# Patient Record
Sex: Male | Born: 1985 | Race: White | Hispanic: No | Marital: Married | Smoking: Current some day smoker
Health system: Southern US, Community
[De-identification: ages and names within clinical notes are randomized; demographics above are authoritative.]

## PROBLEM LIST (undated history)

## (undated) DIAGNOSIS — I10 Essential (primary) hypertension: Secondary | ICD-10-CM

## (undated) HISTORY — DX: Essential (primary) hypertension: I10

## (undated) HISTORY — PX: TOOTH EXTRACTION: SUR596

---
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
## 2007-03-22 ENCOUNTER — Emergency Department (HOSPITAL_COMMUNITY): Admission: EM | Admit: 2007-03-22 | Discharge: 2007-03-22 | Payer: Self-pay | Admitting: Emergency Medicine

## 2010-05-27 ENCOUNTER — Emergency Department (HOSPITAL_COMMUNITY)
Admission: EM | Admit: 2010-05-27 | Discharge: 2010-05-27 | Payer: Self-pay | Source: Home / Self Care | Admitting: Emergency Medicine

## 2011-05-01 LAB — CBC
MCV: 83.1
Platelets: 252
RBC: 5.3
WBC: 6.8

## 2011-05-01 LAB — DIFFERENTIAL
Eosinophils Absolute: 0.2
Lymphocytes Relative: 32
Lymphs Abs: 2.2
Monocytes Relative: 7
Neutro Abs: 4
Neutrophils Relative %: 58

## 2011-05-01 LAB — RAPID URINE DRUG SCREEN, HOSP PERFORMED
Amphetamines: NOT DETECTED
Benzodiazepines: NOT DETECTED
Cocaine: NOT DETECTED
Opiates: NOT DETECTED
Tetrahydrocannabinol: NOT DETECTED

## 2011-05-01 LAB — URINALYSIS, ROUTINE W REFLEX MICROSCOPIC
Bilirubin Urine: NEGATIVE
Hgb urine dipstick: NEGATIVE
Ketones, ur: NEGATIVE
Nitrite: NEGATIVE
Specific Gravity, Urine: <1.005 — ABNORMAL LOW
pH: 7

## 2011-05-01 LAB — BASIC METABOLIC PANEL
BUN: 7
Calcium: 9.3
Creatinine, Ser: 0.89
GFR calc Af Amer: >60
GFR calc non Af Amer: >60

## 2011-09-30 ENCOUNTER — Emergency Department (HOSPITAL_COMMUNITY): Payer: 59

## 2011-09-30 ENCOUNTER — Encounter (HOSPITAL_COMMUNITY): Payer: Self-pay | Admitting: Emergency Medicine

## 2011-09-30 ENCOUNTER — Emergency Department (HOSPITAL_COMMUNITY)
Admission: EM | Admit: 2011-09-30 | Discharge: 2011-10-01 | Disposition: A | Discharge status: Home / Self Care | Payer: 59 | Attending: Emergency Medicine | Admitting: Emergency Medicine

## 2011-09-30 DIAGNOSIS — M542 Cervicalgia: Secondary | ICD-10-CM | POA: Insufficient documentation

## 2011-09-30 DIAGNOSIS — S42109A Fracture of unspecified part of scapula, unspecified shoulder, initial encounter for closed fracture: Secondary | ICD-10-CM | POA: Insufficient documentation

## 2011-09-30 DIAGNOSIS — Y9383 Activity, rough housing and horseplay: Secondary | ICD-10-CM | POA: Insufficient documentation

## 2011-09-30 DIAGNOSIS — R51 Headache: Secondary | ICD-10-CM | POA: Insufficient documentation

## 2011-09-30 DIAGNOSIS — F17200 Nicotine dependence, unspecified, uncomplicated: Secondary | ICD-10-CM | POA: Insufficient documentation

## 2011-09-30 DIAGNOSIS — W010XXA Fall on same level from slipping, tripping and stumbling without subsequent striking against object, initial encounter: Secondary | ICD-10-CM | POA: Insufficient documentation

## 2011-09-30 LAB — BASIC METABOLIC PANEL
BUN: 10 mg/dL (ref 6–23)
CO2: 24 mEq/L (ref 19–32)
Calcium: 9.6 mg/dL (ref 8.4–10.5)
Glucose, Bld: 102 mg/dL — ABNORMAL HIGH (ref 70–99)
Potassium: 3.7 mEq/L (ref 3.5–5.1)
Sodium: 140 mEq/L (ref 135–145)

## 2011-09-30 LAB — CBC
Hemoglobin: 16.4 g/dL (ref 13.0–17.0)
MCV: 82.6 fL (ref 78.0–100.0)
Platelets: 241 10*3/uL (ref 150–400)
RBC: 5.35 MIL/uL (ref 4.22–5.81)
WBC: 8.5 10*3/uL (ref 4.0–10.5)

## 2011-09-30 LAB — DIFFERENTIAL
Eosinophils Relative: 1 % (ref 0–5)
Lymphocytes Relative: 17 % (ref 12–46)
Lymphs Abs: 1.5 10*3/uL (ref 0.7–4.0)
Monocytes Relative: 6 % (ref 3–12)

## 2011-09-30 MED ORDER — HYDROMORPHONE HCL PF 1 MG/ML IJ SOLN
1.0000 mg | Freq: Once | INTRAMUSCULAR | Status: AC
  Administered 2011-09-30: 1 mg via INTRAVENOUS
  Filled 2011-09-30: qty 1

## 2011-09-30 MED ORDER — OXYCODONE-ACETAMINOPHEN 5-325 MG PO TABS: 1.0000 | ORAL_TABLET | Freq: Four times a day (QID) | ORAL | Status: AC | PRN

## 2011-09-30 MED ORDER — IOHEXOL 300 MG/ML  SOLN
80.0000 mL | Freq: Once | INTRAMUSCULAR | Status: AC | PRN
  Administered 2011-09-30: 80 mL via INTRAVENOUS
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
## 2011-09-30 NOTE — Discharge Instructions (Signed)
Follow up with dr. Hilda Lias Thursday or friday

## 2011-09-30 NOTE — ED Notes (Signed)
To CT via stretcher
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
## 2011-10-06 MED FILL — Oxycodone w/ Acetaminophen Tab 5-325 MG: ORAL | Qty: 6 | Status: AC

## 2018-02-24 DIAGNOSIS — R1084 Generalized abdominal pain: Secondary | ICD-10-CM | POA: Diagnosis not present

## 2018-02-24 DIAGNOSIS — K625 Hemorrhage of anus and rectum: Secondary | ICD-10-CM | POA: Diagnosis not present

## 2018-02-24 DIAGNOSIS — R030 Elevated blood-pressure reading, without diagnosis of hypertension: Secondary | ICD-10-CM | POA: Diagnosis not present

## 2018-03-15 ENCOUNTER — Encounter (INDEPENDENT_AMBULATORY_CARE_PROVIDER_SITE_OTHER): Payer: Self-pay | Admitting: Internal Medicine

## 2018-03-15 ENCOUNTER — Other Ambulatory Visit (INDEPENDENT_AMBULATORY_CARE_PROVIDER_SITE_OTHER): Payer: Self-pay | Admitting: Internal Medicine

## 2018-03-15 ENCOUNTER — Ambulatory Visit (INDEPENDENT_AMBULATORY_CARE_PROVIDER_SITE_OTHER): Payer: Commercial Managed Care - PPO | Admitting: Internal Medicine

## 2018-03-15 VITALS — BP 140/92 | HR 68 | Temp 98.2°F | Resp 18 | Ht 70.0 in | Wt 168.9 lb

## 2018-03-15 DIAGNOSIS — R740 Nonspecific elevation of levels of transaminase and lactic acid dehydrogenase [LDH]: Secondary | ICD-10-CM

## 2018-03-15 DIAGNOSIS — K625 Hemorrhage of anus and rectum: Secondary | ICD-10-CM | POA: Diagnosis not present

## 2018-03-15 DIAGNOSIS — K580 Irritable bowel syndrome with diarrhea: Secondary | ICD-10-CM | POA: Diagnosis not present

## 2018-03-15 DIAGNOSIS — R7401 Elevation of levels of liver transaminase levels: Secondary | ICD-10-CM

## 2018-03-15 MED ORDER — DICYCLOMINE HCL 10 MG PO CAPS: 10.0000 mg | ORAL_CAPSULE | Freq: Three times a day (TID) | ORAL | 1 refills | Status: DC | PRN
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
## 2018-03-15 NOTE — Patient Instructions (Signed)
Colonoscopy to be scheduled. 

## 2018-03-16 ENCOUNTER — Encounter (INDEPENDENT_AMBULATORY_CARE_PROVIDER_SITE_OTHER): Payer: Self-pay | Admitting: *Deleted

## 2018-03-16 DIAGNOSIS — K625 Hemorrhage of anus and rectum: Secondary | ICD-10-CM | POA: Insufficient documentation

## 2018-03-16 LAB — HEPATIC FUNCTION PANEL
AG Ratio: 1.9 (calc) (ref 1.0–2.5)
ALBUMIN MSPROF: 4.5 g/dL (ref 3.6–5.1)
ALT: 60 U/L — ABNORMAL HIGH (ref 9–46)
AST: 43 U/L — AB (ref 10–40)
Alkaline phosphatase (APISO): 80 U/L (ref 40–115)
BILIRUBIN DIRECT: 0.1 mg/dL (ref 0.0–0.2)
BILIRUBIN TOTAL: 0.4 mg/dL (ref 0.2–1.2)
Globulin: 2.4 g/dL (calc) (ref 1.9–3.7)
Indirect Bilirubin: 0.3 mg/dL (calc) (ref 0.2–1.2)
Total Protein: 6.9 g/dL (ref 6.1–8.1)

## 2018-03-16 LAB — HEPATITIS C ANTIBODY
Hepatitis C Ab: NONREACTIVE
SIGNAL TO CUT-OFF: 0.01 (ref <1.00)

## 2018-03-16 LAB — HEPATITIS B SURFACE ANTIGEN: Hepatitis B Surface Ag: NONREACTIVE

## 2018-03-16 LAB — HEPATITIS B SURFACE ANTIBODY,QUALITATIVE: Hep B S Ab: REACTIVE — AB

## 2018-03-17 ENCOUNTER — Ambulatory Visit (INDEPENDENT_AMBULATORY_CARE_PROVIDER_SITE_OTHER): Payer: Self-pay | Admitting: Internal Medicine

## 2018-03-17 ENCOUNTER — Encounter (INDEPENDENT_AMBULATORY_CARE_PROVIDER_SITE_OTHER): Payer: Self-pay | Admitting: Internal Medicine

## 2018-03-17 ENCOUNTER — Other Ambulatory Visit (INDEPENDENT_AMBULATORY_CARE_PROVIDER_SITE_OTHER): Payer: Self-pay | Admitting: *Deleted

## 2018-03-17 DIAGNOSIS — R740 Nonspecific elevation of levels of transaminase and lactic acid dehydrogenase [LDH]: Principal | ICD-10-CM

## 2018-03-17 DIAGNOSIS — R7401 Elevation of levels of liver transaminase levels: Secondary | ICD-10-CM

## 2018-03-17 NOTE — Patient Instructions (Signed)
Ross PrimeDavid D Fricker  03/17/2018     @PREFPERIOPPHARMACY @   Your procedure is scheduled on  03/25/2018   [facility] at  1030  A.M.  Call this number if you have problems the morning of surgery:  [phone]   Remember:  Do not eat or drink after midnight.  You may drink clear liquids until  (follow the instructions given to you) .  Clear liquids allowed are:                    Water, Juice (non-citric and without pulp), Carbonated beverages, Clear Tea, Black Coffee only, Plain Jell-O only, Gatorade and Plain Popsicles only    Take these medicines the morning of surgery with A SIP OF WATER [place].    Do not wear jewelry, make-up or nail polish.  Do not wear lotions, powders, or perfumes, or deodorant.  Do not shave 48 hours prior to surgery.  Men may shave face and neck.  Do not bring valuables to the hospital.  [facility] any belongings or valuables.  Contacts, dentures or bridgework may not be worn into surgery.  Leave your suitcase in the car.  After surgery it may be brought to your room.  For patients admitted to the hospital, discharge time will be determined by your treatment team.  Patients discharged the day of surgery will not be allowed to drive home.   Name and phone number of your driver:   family Special instructions:  Follow the diet and prep instructions given to you by Dr Patty Sermonsehman's office.  Please read over the following fact sheets that you were given. Anesthesia Post-op Instructions and Care and Recovery After Surgery       Colonoscopy, Adult A colonoscopy is an exam to look at the large intestine. It is done to check for problems, such as:  Lumps (tumors).  Growths (polyps).  Swelling (inflammation).  Bleeding.  What happens before the procedure? Eating and drinking Follow instructions from your doctor about eating and drinking. These instructions may include:  A few days before the procedure -  follow a low-fiber diet. ? Avoid nuts. ? Avoid seeds. ? Avoid dried fruit. ? Avoid raw fruits. ? Avoid vegetables.  1-3 days before the procedure - follow a clear liquid diet. Avoid liquids that have red or purple dye. Drink only clear liquids, such as: ? Clear broth or bouillon. ? Black coffee or tea. ? Clear juice. ? Clear soft drinks or sports drinks. ? Gelatin dessert. ? Popsicles.  On the day of the procedure - do not eat or drink anything during the 2 hours before the procedure.  Bowel prep If you were prescribed an oral bowel prep:  Take it as told by your doctor. Starting the day before your procedure, you will need to drink a lot of liquid. The liquid will cause you to poop (have bowel movements) until your poop is almost clear or light green.  If your skin or butt gets irritated from diarrhea, you may: ? Wipe the area with wipes that have medicine in them, such as adult wet wipes with aloe and vitamin E. ? Put something on your skin that soothes the area, such as petroleum jelly.  If you throw up (vomit) while drinking the bowel prep, take a break for up to 60 minutes. Then begin the bowel prep again. If you keep throwing up and you cannot  take the bowel prep without throwing up, call your doctor.  General instructions  Ask your doctor about changing or stopping your normal medicines. This is important if you take diabetes medicines or blood thinners.  Plan to have someone take you home from the hospital or clinic. What happens during the procedure?  An IV tube may be put into one of your veins.  You will be given medicine to help you relax (sedative).  To reduce your risk of infection: ? Your doctors will wash their hands. ? Your anal area will be washed with soap.  You will be asked to lie on your side with your knees bent.  Your doctor will get a long, thin, flexible tube ready. The tube will have a camera and a light on the end.  The tube will be put into  your anus.  The tube will be gently put into your large intestine.  Air will be delivered into your large intestine to keep it open. You may feel some pressure or cramping.  The camera will be used to take photos.  A small tissue sample may be removed from your body to be looked at under a microscope (biopsy). If any possible problems are found, the tissue will be sent to a lab for testing.  If small growths are found, your doctor may remove them and have them checked for cancer.  The tube that was put into your anus will be slowly removed. The procedure may vary among doctors and hospitals. What happens after the procedure?  Your doctor will check on you often until the medicines you were given have worn off.  Do not drive for 24 hours after the procedure.  You may have a small amount of blood in your poop.  You may pass gas.  You may have mild cramps or bloating in your belly (abdomen).  It is up to you to get the results of your procedure. Ask your doctor, or the department performing the procedure, when your results will be ready. This information is not intended to replace advice given to you by your health care provider. Make sure you discuss any questions you have with your health care provider. Document Released: 08/08/2010 Document Revised: 05/06/2016 Document Reviewed: 09/17/2015 Elsevier Interactive Patient Education  2017 [employer]  Colonoscopy, Adult, Care After This sheet gives you information about how to care for yourself after your procedure. Your health care provider may also give you more specific instructions. If you have problems or questions, contact your health care provider. What can I expect after the procedure? After the procedure, it is common to have:  A small amount of blood in your stool for 24 hours after the procedure.  Some gas.  Mild abdominal cramping or bloating.  Follow these instructions at home: General instructions   For the  first 24 hours after the procedure: ? Do not drive or use machinery. ? Do not sign important documents. ? Do not drink alcohol. ? Do your regular daily activities at a slower pace than normal. ? Eat soft, easy-to-digest foods. ? Rest often.  Take over-the-counter or prescription medicines only as told by your health care provider.  It is up to you to get the results of your procedure. Ask your health care provider, or the department performing the procedure, when your results will be ready. Relieving cramping and bloating  Try walking around when you have cramps or feel bloated.  Apply heat to your abdomen as told by your  health care provider. Use a heat source that your health care provider recommends, such as a moist heat pack or a heating pad. ? Place a towel between your skin and the heat source. ? Leave the heat on for 20-30 minutes. ? Remove the heat if your skin turns bright red. This is especially important if you are unable to feel pain, heat, or cold. You may have a greater risk of getting burned. Eating and drinking  Drink enough fluid to keep your urine clear or pale yellow.  Resume your normal diet as instructed by your health care provider. Avoid heavy or fried foods that are hard to digest.  Avoid drinking alcohol for as long as instructed by your health care provider. Contact a health care provider if:  You have blood in your stool 2-3 days after the procedure. Get help right away if:  You have more than a small spotting of blood in your stool.  You pass large blood clots in your stool.  Your abdomen is swollen.  You have nausea or vomiting.  You have a fever.  You have increasing abdominal pain that is not relieved with medicine. This information is not intended to replace advice given to you by your health care provider. Make sure you discuss any questions you have with your health care provider. Document Released: 02/18/2004 Document Revised: 03/30/2016  Document Reviewed: 09/17/2015 Elsevier Interactive Patient Education  2018 [place] Anesthesia is a term that refers to techniques, procedures, and medicines that help a person stay safe and comfortable during a medical procedure. Monitored anesthesia care, or sedation, is one type of anesthesia. Your anesthesia specialist may recommend sedation if you will be having a procedure that does not require you to be unconscious, such as:  Cataract surgery.  A dental procedure.  A biopsy.  A colonoscopy.  During the procedure, you may receive a medicine to help you relax (sedative). There are three levels of sedation:  Mild sedation. At this level, you may feel awake and relaxed. You will be able to follow directions.  Moderate sedation. At this level, you will be sleepy. You may not remember the procedure.  Deep sedation. At this level, you will be asleep. You will not remember the procedure.  The more medicine you are given, the deeper your level of sedation will be. Depending on how you respond to the procedure, the anesthesia specialist may change your level of sedation or the type of anesthesia to fit your needs. An anesthesia specialist will monitor you closely during the procedure. Let your health care provider know about:  Any allergies you have.  All medicines you are taking, including vitamins, herbs, eye drops, creams, and over-the-counter medicines.  Any use of steroids (by mouth or as a cream).  Any problems you or family members have had with sedatives and anesthetic medicines.  Any blood disorders you have.  Any surgeries you have had.  Any medical conditions you have, such as sleep apnea.  Whether you are pregnant or may be pregnant.  Any use of cigarettes, alcohol, or street drugs. What are the risks? Generally, this is a safe procedure. However, problems may occur, including:  Getting too much medicine  (oversedation).  Nausea.  Allergic reaction to medicines.  Trouble breathing. If this happens, a breathing tube may be used to help with breathing. It will be removed when you are awake and breathing on your own.  Heart trouble.  Lung trouble.  Before the  procedure Staying hydrated Follow instructions from your health care provider about hydration, which may include:  Up to 2 hours before the procedure - you may continue to drink clear liquids, such as water, clear fruit juice, black coffee, and plain tea.  Eating and drinking restrictions Follow instructions from your health care provider about eating and drinking, which may include:  8 hours before the procedure - stop eating heavy meals or foods such as meat, fried foods, or fatty foods.  6 hours before the procedure - stop eating light meals or foods, such as toast or cereal.  6 hours before the procedure - stop drinking milk or drinks that contain milk.  2 hours before the procedure - stop drinking clear liquids.  Medicines Ask your health care provider about:  Changing or stopping your regular medicines. This is especially important if you are taking diabetes medicines or blood thinners.  Taking medicines such as aspirin and ibuprofen. These medicines can thin your blood. Do not take these medicines before your procedure if your health care provider instructs you not to.  Tests and exams  You will have a physical exam.  You may have blood tests done to show: ? How well your kidneys and liver are working. ? How well your blood can clot.  General instructions  Plan to have someone take you home from the hospital or clinic.  If you will be going home right after the procedure, plan to have someone with you for 24 hours.  What happens during the procedure?  Your blood pressure, heart rate, breathing, level of pain and overall condition will be monitored.  An IV tube will be inserted into one of your  veins.  Your anesthesia specialist will give you medicines as needed to keep you comfortable during the procedure. This may mean changing the level of sedation.  The procedure will be performed. After the procedure  Your blood pressure, heart rate, breathing rate, and blood oxygen level will be monitored until the medicines you were given have worn off.  Do not drive for 24 hours if you received a sedative.  You may: ? Feel sleepy, clumsy, or nauseous. ? Feel forgetful about what happened after the procedure. ? Have a sore throat if you had a breathing tube during the procedure. ? Vomit. This information is not intended to replace advice given to you by your health care provider. Make sure you discuss any questions you have with your health care provider. Document Released: 04/01/2005 Document Revised: 12/13/2015 Document Reviewed: 10/27/2015 Elsevier Interactive Patient Education  2018 [place], Care After These instructions provide you with information about caring for yourself after your procedure. Your health care provider may also give you more specific instructions. Your treatment has been planned according to current medical practices, but problems sometimes occur. Call your health care provider if you have any problems or questions after your procedure. What can I expect after the procedure? After your procedure, it is common to:  Feel sleepy for several hours.  Feel clumsy and have poor balance for several hours.  Feel forgetful about what happened after the procedure.  Have poor judgment for several hours.  Feel nauseous or vomit.  Have a sore throat if you had a breathing tube during the procedure.  Follow these instructions at home: For at least 24 hours after the procedure:   Do not: ? Participate in activities in which you could fall or become injured. ? Drive. ? Use heavy  machinery. ? Drink alcohol. ? Take sleeping pills or  medicines that cause drowsiness. ? Make important decisions or sign legal documents. ? Take care of children on your own.  Rest. Eating and drinking  Follow the diet that is recommended by your health care provider.  If you vomit, drink water, juice, or soup when you can drink without vomiting.  Make sure you have little or no nausea before eating solid foods. General instructions  Have a responsible adult stay with you until you are awake and alert.  Take over-the-counter and prescription medicines only as told by your health care provider.  If you smoke, do not smoke without supervision.  Keep all follow-up visits as told by your health care provider. This is important. Contact a health care provider if:  You keep feeling nauseous or you keep vomiting.  You feel light-headed.  You develop a rash.  You have a fever. Get help right away if:  You have trouble breathing. This information is not intended to replace advice given to you by your health care provider. Make sure you discuss any questions you have with your health care provider. Document Released: 10/27/2015 Document Revised: 02/26/2016 Document Reviewed: 10/27/2015 Elsevier Interactive Patient Education  [employer].

## 2018-03-22 ENCOUNTER — Encounter (HOSPITAL_COMMUNITY)
Admission: RE | Admit: 2018-03-22 | Discharge: 2018-03-22 | Disposition: A | Discharge status: Home / Self Care | Payer: Commercial Managed Care - PPO | Source: Ambulatory Visit | Attending: Internal Medicine | Admitting: Internal Medicine

## 2018-03-22 ENCOUNTER — Encounter (HOSPITAL_COMMUNITY): Payer: Self-pay

## 2018-03-22 DIAGNOSIS — R740 Nonspecific elevation of levels of transaminase and lactic acid dehydrogenase [LDH]: Secondary | ICD-10-CM | POA: Diagnosis not present

## 2018-03-22 DIAGNOSIS — Z01812 Encounter for preprocedural laboratory examination: Secondary | ICD-10-CM | POA: Diagnosis not present

## 2018-03-22 LAB — CBC WITH DIFFERENTIAL/PLATELET
Basophils Absolute: 0 10*3/uL (ref 0.0–0.1)
Basophils Relative: 0 %
EOS PCT: 2 %
Eosinophils Absolute: 0.2 10*3/uL (ref 0.0–0.7)
HCT: 45.3 % (ref 39.0–52.0)
Hemoglobin: 15.9 g/dL (ref 13.0–17.0)
LYMPHS PCT: 23 %
Lymphs Abs: 2.4 10*3/uL (ref 0.7–4.0)
MCH: 30 pg (ref 26.0–34.0)
MCHC: 35.1 g/dL (ref 30.0–36.0)
MCV: 85.5 fL (ref 78.0–100.0)
MONO ABS: 0.5 10*3/uL (ref 0.1–1.0)
Monocytes Relative: 5 %
Neutro Abs: 7.2 10*3/uL (ref 1.7–7.7)
Neutrophils Relative %: 70 %
PLATELETS: 231 10*3/uL (ref 150–400)
RBC: 5.3 MIL/uL (ref 4.22–5.81)
RDW: 12.2 % (ref 11.5–15.5)
WBC: 10.3 10*3/uL (ref 4.0–10.5)

## 2018-03-24 ENCOUNTER — Ambulatory Visit (HOSPITAL_COMMUNITY)
Admission: RE | Admit: 2018-03-24 | Discharge: 2018-03-24 | Disposition: A | Discharge status: Home / Self Care | Payer: Commercial Managed Care - PPO | Source: Ambulatory Visit | Attending: Internal Medicine | Admitting: Internal Medicine

## 2018-03-24 DIAGNOSIS — R7989 Other specified abnormal findings of blood chemistry: Secondary | ICD-10-CM | POA: Diagnosis not present

## 2018-03-24 DIAGNOSIS — R740 Nonspecific elevation of levels of transaminase and lactic acid dehydrogenase [LDH]: Secondary | ICD-10-CM | POA: Diagnosis not present

## 2018-03-24 DIAGNOSIS — Z01812 Encounter for preprocedural laboratory examination: Secondary | ICD-10-CM | POA: Diagnosis not present

## 2018-03-24 DIAGNOSIS — R7401 Elevation of levels of liver transaminase levels: Secondary | ICD-10-CM

## 2018-03-25 ENCOUNTER — Ambulatory Visit (HOSPITAL_COMMUNITY): Payer: Commercial Managed Care - PPO | Admitting: Anesthesiology

## 2018-03-25 ENCOUNTER — Encounter (HOSPITAL_COMMUNITY): Payer: Self-pay | Admitting: *Deleted

## 2018-03-25 ENCOUNTER — Ambulatory Visit (HOSPITAL_COMMUNITY)
Admission: RE | Admit: 2018-03-25 | Discharge: 2018-03-25 | Disposition: A | Discharge status: Home / Self Care | Payer: Commercial Managed Care - PPO | Source: Ambulatory Visit | Attending: Internal Medicine | Admitting: Internal Medicine

## 2018-03-25 ENCOUNTER — Other Ambulatory Visit: Payer: Self-pay

## 2018-03-25 ENCOUNTER — Encounter (HOSPITAL_COMMUNITY)
Admission: RE | Disposition: A | Discharge status: Home / Self Care | Payer: Self-pay | Source: Ambulatory Visit | Attending: Internal Medicine

## 2018-03-25 DIAGNOSIS — F17210 Nicotine dependence, cigarettes, uncomplicated: Secondary | ICD-10-CM | POA: Diagnosis not present

## 2018-03-25 DIAGNOSIS — R7401 Elevation of levels of liver transaminase levels: Secondary | ICD-10-CM

## 2018-03-25 DIAGNOSIS — I10 Essential (primary) hypertension: Secondary | ICD-10-CM | POA: Insufficient documentation

## 2018-03-25 DIAGNOSIS — K648 Other hemorrhoids: Secondary | ICD-10-CM | POA: Insufficient documentation

## 2018-03-25 DIAGNOSIS — K921 Melena: Secondary | ICD-10-CM | POA: Diagnosis not present

## 2018-03-25 DIAGNOSIS — Z79899 Other long term (current) drug therapy: Secondary | ICD-10-CM | POA: Insufficient documentation

## 2018-03-25 DIAGNOSIS — R740 Nonspecific elevation of levels of transaminase and lactic acid dehydrogenase [LDH]: Secondary | ICD-10-CM

## 2018-03-25 DIAGNOSIS — K625 Hemorrhage of anus and rectum: Secondary | ICD-10-CM | POA: Insufficient documentation

## 2018-03-25 HISTORY — PX: COLONOSCOPY WITH PROPOFOL: SHX5780

## 2018-03-25 LAB — FERRITIN: Ferritin: 114 ng/mL (ref 24–336)

## 2018-03-25 SURGERY — COLONOSCOPY WITH PROPOFOL
Anesthesia: Monitor Anesthesia Care

## 2018-03-25 MED ORDER — MEPERIDINE HCL 100 MG/ML IJ SOLN: 6.2500 mg | INTRAMUSCULAR | Status: DC | PRN

## 2018-03-25 MED ORDER — MIDAZOLAM HCL 2 MG/2ML IJ SOLN
INTRAMUSCULAR | Status: AC
  Filled 2018-03-25: qty 2

## 2018-03-25 MED ORDER — PROMETHAZINE HCL 25 MG/ML IJ SOLN: 6.2500 mg | INTRAMUSCULAR | Status: DC | PRN

## 2018-03-25 MED ORDER — MIDAZOLAM HCL 5 MG/5ML IJ SOLN
INTRAMUSCULAR | Status: DC | PRN
  Administered 2018-03-25: 2 mg via INTRAVENOUS

## 2018-03-25 MED ORDER — PROPOFOL 500 MG/50ML IV EMUL
INTRAVENOUS | Status: DC | PRN
  Administered 2018-03-25: 150 ug/kg/min via INTRAVENOUS
  Administered 2018-03-25: 12:00:00 via INTRAVENOUS

## 2018-03-25 MED ORDER — HYDROCODONE-ACETAMINOPHEN 7.5-325 MG PO TABS: 1.0000 | ORAL_TABLET | Freq: Once | ORAL | Status: DC | PRN

## 2018-03-25 MED ORDER — LACTATED RINGERS IV SOLN: INTRAVENOUS | Status: DC

## 2018-03-25 MED ORDER — PROPOFOL 10 MG/ML IV BOLUS
INTRAVENOUS | Status: DC | PRN
  Administered 2018-03-25 (×4): 20 mg via INTRAVENOUS

## 2018-03-25 MED ORDER — LACTATED RINGERS IV SOLN
INTRAVENOUS | Status: DC
  Administered 2018-03-25: 11:00:00 via INTRAVENOUS

## 2018-03-25 MED ORDER — LIDOCAINE HCL (PF) 1 % IJ SOLN
INTRAMUSCULAR | Status: AC
  Filled 2018-03-25: qty 5

## 2018-03-25 MED ORDER — HYDROMORPHONE HCL 1 MG/ML IJ SOLN: 0.2500 mg | INTRAMUSCULAR | Status: DC | PRN

## 2018-03-25 NOTE — Transfer of Care (Signed)
Immediate Anesthesia Transfer of Care Note  Patient: Ross Phillips  Procedure(s) Performed: COLONOSCOPY WITH PROPOFOL (N/A )  Patient Location: PACU  Anesthesia Type:MAC  Level of Consciousness: awake  Airway & Oxygen Therapy: Patient Spontanous Breathing  Post-op Assessment: Report given to RN  Post vital signs: Reviewed  Last Vitals:  Vitals Value Taken Time  BP 99/55 03/25/2018 12:10 PM  Temp    Pulse 76 03/25/2018 12:13 PM  Resp 19 03/25/2018 12:13 PM  SpO2 98 % 03/25/2018 12:13 PM  Vitals shown include unvalidated device data.  Last Pain:  Vitals:   03/25/18 1137  TempSrc:   PainSc: 0-No pain         Complications: No apparent anesthesia complications

## 2018-03-25 NOTE — Anesthesia Preprocedure Evaluation (Signed)
Anesthesia Evaluation  Patient identified by MRN, date of birth, ID band Patient awake    Reviewed: Allergy & Precautions, H&P , NPO status , Patient's Chart, lab work & pertinent test results, reviewed documented beta blocker date and time   Airway Mallampati: II  TM Distance: >3 FB Neck ROM: full    Dental no notable dental hx. (+) Teeth Intact, Dental Advidsory Given   Pulmonary neg pulmonary ROS, Current Smoker,    Pulmonary exam normal breath sounds clear to auscultation       Cardiovascular Exercise Tolerance: Good hypertension, negative cardio ROS   Rhythm:regular Rate:Normal     Neuro/Psych negative neurological ROS  negative psych ROS   GI/Hepatic negative GI ROS, Neg liver ROS,   Endo/Other  negative endocrine ROS  Renal/GU negative Renal ROS  negative genitourinary   Musculoskeletal   Abdominal   Peds  Hematology negative hematology ROS (+)   Anesthesia Other Findings HTN stable at 140s no meds  Reproductive/Obstetrics negative OB ROS                             Anesthesia Physical Anesthesia Plan  ASA: II  Anesthesia Plan: MAC   Post-op Pain Management:    Induction:   PONV Risk Score and Plan:   Airway Management Planned:   Additional Equipment:   Intra-op Plan:   Post-operative Plan:   Informed Consent: I have reviewed the patients History and Physical, chart, labs and discussed the procedure including the risks, benefits and alternatives for the proposed anesthesia with the patient or authorized representative who has indicated his/her understanding and acceptance.   Dental Advisory Given  Plan Discussed with: CRNA and Anesthesiologist  Anesthesia Plan Comments:         Anesthesia Quick Evaluation

## 2018-03-25 NOTE — Op Note (Signed)
[facility] Patient Name: Ross Phillips Procedure Date: 03/25/2018 11:18 AM MRN: [id number] Date of Birth: [birth year] Attending MD: Lionel December , MD CSN: [id number] Age: 32 Admit Type: Outpatient Procedure:                Colonoscopy Indications:              Hematochezia Providers:                Lionel December, MD, Criselda Peaches. Patsy Lager, RN, Burke Keels, Technician Referring MD:             Lianne Moris, [facility] Medicines:                Propofol per Anesthesia Complications:            No immediate complications. Estimated Blood Loss:     Estimated blood loss: none. Procedure:                Pre-Anesthesia Assessment:                           - Prior to the procedure, a History and Physical                            was performed, and patient medications and                            allergies were reviewed. The patient's tolerance of                            previous anesthesia was also reviewed. The risks                            and benefits of the procedure and the sedation                            options and risks were discussed with the patient.                            All questions were answered, and informed consent                            was obtained. Prior Anticoagulants: The patient has                            taken no previous anticoagulant or antiplatelet                            agents. ASA Grade Assessment: II - A patient with                            mild systemic disease. After reviewing the risks  and benefits, the patient was deemed in                            satisfactory condition to undergo the procedure.                           After obtaining informed consent, the colonoscope                            was passed under direct vision. Throughout the                            procedure, the patient's blood pressure, pulse, and                            oxygen saturations were  monitored continuously. The                            [id number]-[id number] ([id number]) scope was introduced through                            the and advanced to the the terminal ileum, with                            identification of the appendiceal orifice and IC                            valve. The colonoscopy was performed without                            difficulty. The patient tolerated the procedure                            well. The quality of the bowel preparation was                            excellent. The terminal ileum, ileocecal valve,                            appendiceal orifice, and rectum were photographed. Scope In: 11:46:29 AM Scope Out: 12:04:31 PM Scope Withdrawal Time: 0 hours 9 minutes 20 seconds  Total Procedure Duration: 0 hours 18 minutes 2 seconds  Findings:      The perianal and digital rectal examinations were normal.      The terminal ileum appeared normal.      The colon (entire examined portion) appeared normal.      Internal hemorrhoids were found during retroflexion. The hemorrhoids       were medium-sized. Impression:               - The examined portion of the ileum was normal.                           - The entire examined colon is normal.                           -  Internal hemorrhoids.                           - No specimens collected. Moderate Sedation:      Per Anesthesia Care Recommendation:           - Patient has a contact number available for                            emergencies. The signs and symptoms of potential                            delayed complications were discussed with the                            patient. Return to normal activities tomorrow.                            Written discharge instructions were provided to the                            patient.                           - High fiber diet today.                           - Continue present medications.                           - Repeat colonoscopy at age 39  for screening                            purposes. Procedure Code(s):        --- Professional ---                           [phone], Colonoscopy, flexible; diagnostic, including                            collection of specimen(s) by brushing or washing,                            when performed (separate procedure) Diagnosis Code(s):        --- Professional ---                           K64.8, Other hemorrhoids                           K92.1, Melena (includes Hematochezia) CPT copyright 2017 American Medical Association. All rights reserved. The codes documented in this report are preliminary and upon coder review may  be revised to meet current compliance requirements. Lionel December, MD Lionel December, MD 03/25/2018 12:15:49 PM This report has been signed electronically. Number of Addenda: 0

## 2018-03-25 NOTE — Anesthesia Postprocedure Evaluation (Signed)
Anesthesia Post Note  Patient: Ross Phillips  Procedure(s) Performed: COLONOSCOPY WITH PROPOFOL (N/A )  Patient location during evaluation: PACU Anesthesia Type: MAC Level of consciousness: awake and alert and oriented Pain management: pain level controlled Vital Signs Assessment: post-procedure vital signs reviewed and stable Respiratory status: spontaneous breathing Cardiovascular status: blood pressure returned to baseline and stable Postop Assessment: no apparent nausea or vomiting Anesthetic complications: no     Last Vitals:  Vitals:   03/25/18 1054 03/25/18 1210  BP: 121/80 (!) 99/55  Pulse: 78 80  Resp: 18 18  Temp: 36.7 C 36.7 C  SpO2: 98% 97%    Last Pain:  Vitals:   03/25/18 1137  TempSrc:   PainSc: 0-No pain                 Chea Malan

## 2018-03-25 NOTE — Discharge Instructions (Signed)
Resume usual medications.  Keep Goody powder use to minimum. High-fiber diet. No driving for 24 hours. Physician will call with results of blood test.  Colonoscopy, Adult, Care After This sheet gives you information about how to care for yourself after your procedure. Your doctor may also give you more specific instructions. If you have problems or questions, call your doctor. Follow these instructions at home: General instructions   For the first 24 hours after the procedure: ? Do not drive or use machinery. ? Do not sign important documents. ? Do not drink alcohol. ? Do your daily activities more slowly than normal. ? Eat foods that are soft and easy to digest. ? Rest often.  Take over-the-counter or prescription medicines only as told by your doctor.  It is up to you to get the results of your procedure. Ask your doctor, or the department performing the procedure, when your results will be ready. To help cramping and bloating:  Try walking around.  Put heat on your belly (abdomen) as told by your doctor. Use a heat source that your doctor recommends, such as a moist heat pack or a heating pad. ? Put a towel between your skin and the heat source. ? Leave the heat on for 20-30 minutes. ? Remove the heat if your skin turns bright red. This is especially important if you cannot feel pain, heat, or cold. You can get burned. Eating and drinking  Drink enough fluid to keep your pee (urine) clear or pale yellow.  Return to your normal diet as told by your doctor. Avoid heavy or fried foods that are hard to digest.  Avoid drinking alcohol for as long as told by your doctor. Contact a doctor if:  You have blood in your poop (stool) 2-3 days after the procedure. Get help right away if:  You have more than a small amount of blood in your poop.  You see large clumps of tissue (blood clots) in your poop.  Your belly is swollen.  You feel sick to your stomach (nauseous).  You  throw up (vomit).  You have a fever.  You have belly pain that gets worse, and medicine does not help your pain. This information is not intended to replace advice given to you by your health care provider. Make sure you discuss any questions you have with your health care provider. Document Released: 08/08/2010 Document Revised: 03/30/2016 Document Reviewed: 03/30/2016 Elsevier Interactive Patient Education  2017 [employer].

## 2018-03-25 NOTE — H&P (Signed)
Ross Phillips is an 32 y.o. male.   Chief Complaint: Patient is here for colonoscopy. HPI: Patient is 32 year old Caucasian male who was evaluated in the office last week for 1 year history of intermittent rectal bleeding and postprandial diarrhea.  He is therefore undergoing diagnostic colonoscopy. He is also being evaluated for mildly elevated transaminases. Please refer to my my consultation note from last week for rest of the details.  Past Medical History:  Diagnosis Date  . Hypertension     Past Surgical History:  Procedure Laterality Date  . TOOTH EXTRACTION     as a child    Family History  Problem Relation Age of Onset  . Anxiety disorder Mother   . Hypertension Father   . High Cholesterol Father   . Irritable bowel syndrome Sister   . Healthy Brother   . Healthy Son   . Healthy Son    Social History:  reports that he has been smoking cigarettes. He has a 2.00 pack-year smoking history. His smokeless tobacco use includes snuff. He reports that he drinks alcohol. He reports that he does not use drugs.  Allergies: No Known Allergies  Medications Prior to Admission  Medication Sig Dispense Refill  . GOODYS EXTRA STRENGTH 500-325-65 MG PACK Take 1 packet by mouth daily as needed (for pain.).    Marland Kitchen ibuprofen (ADVIL,MOTRIN) 200 MG tablet Take 600-800 mg by mouth every 8 (eight) hours as needed (for pain).    . Naphazoline HCl (CLEAR EYES OP) Place 1 drop into both eyes 3 (three) times daily as needed (dry/irritated eyes.).    Marland Kitchen dicyclomine (BENTYL) 10 MG capsule Take 1 capsule (10 mg total) by mouth 3 (three) times daily as needed for spasms. (Patient not taking: Reported on 03/17/2018) 90 capsule 1    No results found for this or any previous visit (from the past 48 hour(s)). US Abdomen Complete  Result Date: 03/24/2018 CLINICAL DATA:  Elevated ALT EXAM: ABDOMEN ULTRASOUND COMPLETE COMPARISON:  None. FINDINGS: Gallbladder: No gallstones or wall thickening visualized. No  sonographic Murphy sign noted by sonographer. Common bile duct: Diameter: 2 mm Liver: No focal lesion identified. Within normal limits in parenchymal echogenicity. Portal vein is patent on color Doppler imaging with normal direction of blood flow towards the liver. IVC: No abnormality visualized. Pancreas: Visualized portion unremarkable. Spleen: Size and appearance within normal limits. Right Kidney: Length: 8.8 cm. Echogenicity within normal limits. No mass or hydronephrosis visualized. Left Kidney: Length: 10.5 cm. Echogenicity within normal limits. No mass or hydronephrosis visualized. Abdominal aorta: No aneurysm visualized. Other findings: None. IMPRESSION: Normal abdominal sonogram.  Normal liver with no liver masses. Electronically Signed   By: Delbert Phenix M.D.   On: 03/24/2018 13:37    ROS  Blood pressure 121/80, pulse 78, temperature 98.1 F (36.7 C), temperature source Oral, resp. rate 18, SpO2 98 %. Physical Exam  Constitutional: He appears well-developed and well-nourished.  HENT:  Mouth/Throat: Oropharynx is clear and moist.  Eyes: Conjunctivae are normal. No scleral icterus.  Neck: No thyromegaly present.  Cardiovascular: Normal rate, regular rhythm and normal heart sounds.  No murmur heard. Respiratory: Effort normal and breath sounds normal.  GI: Soft. He exhibits no distension and no mass. There is no tenderness.  Lymphadenopathy:    He has no cervical adenopathy.  Neurological: He is alert.  Skin: Skin is warm and dry.  He has multiple tattoos including a few on his chest and upper extremities.     Assessment/Plan Chronic  hematochezia. Diagnostic colonoscopy.  Lionel December, MD 03/25/2018, 11:31 AM

## 2018-03-26 LAB — ALPHA-1-ANTITRYPSIN: A-1 Antitrypsin, Ser: 118 mg/dL (ref 90–200)

## 2018-03-28 ENCOUNTER — Ambulatory Visit (INDEPENDENT_AMBULATORY_CARE_PROVIDER_SITE_OTHER): Payer: Self-pay | Admitting: Internal Medicine

## 2018-03-30 ENCOUNTER — Encounter (HOSPITAL_COMMUNITY): Payer: Self-pay | Admitting: Internal Medicine

## 2018-05-20 ENCOUNTER — Emergency Department (HOSPITAL_COMMUNITY): Payer: Commercial Managed Care - PPO

## 2018-05-20 ENCOUNTER — Emergency Department (HOSPITAL_COMMUNITY)
Admission: EM | Admit: 2018-05-20 | Discharge: 2018-05-20 | Disposition: A | Discharge status: Home / Self Care | Payer: Commercial Managed Care - PPO | Attending: Emergency Medicine | Admitting: Emergency Medicine

## 2018-05-20 ENCOUNTER — Encounter (HOSPITAL_COMMUNITY): Payer: Self-pay | Admitting: Emergency Medicine

## 2018-05-20 ENCOUNTER — Other Ambulatory Visit: Payer: Self-pay

## 2018-05-20 DIAGNOSIS — I10 Essential (primary) hypertension: Secondary | ICD-10-CM | POA: Diagnosis not present

## 2018-05-20 DIAGNOSIS — M25512 Pain in left shoulder: Secondary | ICD-10-CM | POA: Insufficient documentation

## 2018-05-20 DIAGNOSIS — F17210 Nicotine dependence, cigarettes, uncomplicated: Secondary | ICD-10-CM | POA: Insufficient documentation

## 2018-05-20 MED ORDER — NAPROXEN 500 MG PO TABS: 500.0000 mg | ORAL_TABLET | Freq: Two times a day (BID) | ORAL | 0 refills | Status: DC

## 2018-05-20 MED ORDER — METHOCARBAMOL 500 MG PO TABS: 500.0000 mg | ORAL_TABLET | Freq: Three times a day (TID) | ORAL | 0 refills | Status: DC | PRN

## 2018-05-20 NOTE — ED Provider Notes (Signed)
[facility] EMERGENCY DEPARTMENT Provider Note   CSN: [id number] Arrival date & time: 05/20/18  1204     History   Chief Complaint Chief Complaint  Patient presents with  . Shoulder Pain    HPI Ross Phillips is a 32 y.o. male with a hx of tobacco abuse and HTN who presents to the ED with complaints of left shoulder pain that started today while lifting weights. Patient states he was using free weights with bench press type motions and felt a quick onset pain/tightening sensation to the L posterior shoulder area. Pain has been constant since onset, 10/10 in severity, worse with movement and deep breathing, no alleviating factors. He has applied topical patches without much change. Denies traumatic injury. Denies fever, chills, numbness, weakness, or midline neck pain. Hx of L scapular fracture.    HPI  Past Medical History:  Diagnosis Date  . Hypertension     Patient Active Problem List   Diagnosis Date Noted  . Rectal bleeding 03/16/2018    Past Surgical History:  Procedure Laterality Date  . COLONOSCOPY WITH PROPOFOL N/A 03/25/2018   Procedure: COLONOSCOPY WITH PROPOFOL;  Surgeon: Malissa Hippo, MD;  Location: [facility];  Service: Endoscopy;  Laterality: N/A;  12:05  . TOOTH EXTRACTION     as a child        Home Medications    Prior to Admission medications   Medication Sig Start Date End Date Taking? Authorizing Provider  dicyclomine (BENTYL) 10 MG capsule Take 1 capsule (10 mg total) by mouth 3 (three) times daily as needed for spasms. Patient not taking: Reported on 03/17/2018 03/15/18   Malissa Hippo, MD  GOODYS EXTRA STRENGTH [phone] MG PACK Take 1 packet by mouth daily as needed (for pain.).    [provider]  ibuprofen (ADVIL,MOTRIN) 200 MG tablet Take 600-800 mg by mouth every 8 (eight) hours as needed (for pain).    [provider]  Naphazoline HCl (CLEAR EYES OP) Place 1 drop into both eyes 3 (three) times daily as needed  (dry/irritated eyes.).    [provider]    Family History Family History  Problem Relation Age of Onset  . Anxiety disorder Mother   . Hypertension Father   . High Cholesterol Father   . Irritable bowel syndrome Sister   . Healthy Brother   . Healthy Son   . Healthy Son     Social History Social History   Tobacco Use  . Smoking status: Current Some Day Smoker    Packs/day: 0.50    Years: 4.00    Pack years: 2.00    Types: Cigarettes  . Smokeless tobacco: Current User    Types: Snuff  Substance Use Topics  . Alcohol use: Yes    Comment: occasionally  . Drug use: No     Allergies   Patient has no known allergies.   Review of Systems Review of Systems  Constitutional: Negative for chills and fever.  Respiratory: Negative for shortness of breath.   Cardiovascular: Negative for chest pain.  Musculoskeletal: Positive for arthralgias (Left shoulder).  Skin: Negative for color change, rash and wound.  Neurological: Negative for weakness and numbness.     Physical Exam Updated Vital Signs BP (!) 146/92 (BP Location: Right Arm)   Pulse 92   Temp 97.8 F (36.6 C) (Oral)   Resp 17   Ht 5\' 8"  (1.727 m)   Wt 74.8 kg   SpO2 97%   BMI  25.09 kg/m   Physical Exam  Constitutional: He appears well-developed and well-nourished. No distress.  HENT:  Head: Normocephalic and atraumatic.  Eyes: Conjunctivae are normal. Right eye exhibits no discharge. Left eye exhibits no discharge.  Neck:  Full active range of motion.  No midline tenderness to palpation.  No palpable step-off.  Patient has left trapezius tenderness to palpation with palpable muscle spasm.  Cardiovascular: Normal rate and regular rhythm.  2+ symmetric radial pulses.  Pulmonary/Chest: Effort normal and breath sounds normal.  Musculoskeletal:  No obvious deformity, appreciable swelling, erythema, ecchymosis, warmth, open wounds. Back: No midline tenderness Upper extremities: Patient has full  active range of motion to bilateral shoulders, elbows, wrist, and all digits.  He has discomfort with left shoulder flexion as well as abduction.  He is tender to palpation over the supraspinatus area as well as the teres minor and infraspinatus regions.  He is also tender over the trapezius muscle.  No palpable bony tenderness.  Neurovascularly intact distally.  Neurological: He is alert.  Clear speech.  Sensation grossly intact bilateral upper extremities.  5 out of 5 symmetric grip strength.  Able to perform okay sign, thumbs up, and cross second/third digits.  Ambulatory.  Psychiatric: He has a normal mood and affect. His behavior is normal. Thought content normal.  Nursing note and vitals reviewed.    ED Treatments / Results  Labs (all labs ordered are listed, but only abnormal results are displayed) Labs Reviewed - No data to display  EKG None  Radiology Dg Chest 2 View  Result Date: 05/20/2018 CLINICAL DATA:  Left shoulder pain. EXAM: CHEST - 2 VIEW COMPARISON:  01/17/2015 FINDINGS: The heart size and mediastinal contours are within normal limits. Both lungs are clear. The visualized skeletal structures are unremarkable. IMPRESSION: No active cardiopulmonary disease. Electronically Signed   By: Marlan Palau M.D.   On: 05/20/2018 12:38   Dg Shoulder Left  Result Date: 05/20/2018 CLINICAL DATA:  Left shoulder pain EXAM: LEFT SHOULDER - 2+ VIEW COMPARISON:  09/30/2011 FINDINGS: Normal alignment no fracture. AC joint intact. No significant degenerative change. Small sclerotic focus in the medial humeral head is unchanged from the prior study. Scapular fracture on the prior study has healed. IMPRESSION: Negative. Electronically Signed   By: Marlan Palau M.D.   On: 05/20/2018 12:37    Procedures Procedures (including critical care time)  Medications Ordered in ED Medications - No data to display   Initial Impression / Assessment and Plan / ED Course  I have reviewed the triage  vital signs and the nursing notes.  Pertinent labs & imaging results that were available during my care of the patient were reviewed by me and considered in my medical decision making (see chart for details).    Patient presents to the ED with complaints of pain to the  L shoulder pain s/p lifting at the gym. Exam without obvious deformity or open wounds. ROM intact. Tender to palpation over the left trapezius and rotator cuff muscles. NVI distally. Xray negative for fracture/dislocation. No erythema/warmth/fever to raise concern for infectious etiology. Given hx and onset do not suspect PE. CXR without pneumothorax. Suspect muscle strain/spasm, prescriptions for naproxen/robaxin- instructed not to drive/operate heavy machinery when taking robaxin, also recommended application of heat. I discussed results, treatment plan, need for follow-up, and return precautions with the patient. Provided opportunity for questions, patient confirmed understanding and are in agreement with plan.    Final Clinical Impressions(s) / ED Diagnoses   Final diagnoses:  Acute pain of left shoulder    ED Discharge Orders         Ordered    naproxen (NAPROSYN) 500 MG tablet  2 times daily     05/20/18 1317    methocarbamol (ROBAXIN) 500 MG tablet  Every 8 hours PRN     05/20/18 1317           Rice Walsh, [place] R, PA-C 05/20/18 1319    Raeford Razor, MD 05/20/18 1454

## 2018-05-20 NOTE — ED Triage Notes (Signed)
Patient complaining of left shoulder pain while working out. States "I think I may have broken my shoulder blade again."

## 2018-05-20 NOTE — Discharge Instructions (Addendum)
You are seen in the emergency department today for left shoulder pain.  Your x-rays did not show fracture or dislocation or problem with the underlying lung.  We suspect this is a muscle strain/spasm and are sending home with the following medicines:  - Naproxen is a nonsteroidal anti-inflammatory medication that will help with pain and swelling. Be sure to take this medication as prescribed with food, 1 pill every 12 hours,  It should be taken with food, as it can cause stomach upset, and more seriously, stomach bleeding. Do not take other nonsteroidal anti-inflammatory medications with this such as Advil, Motrin, Aleve, Mobic, Goodie Powder, or Motrin.    - Robaxin is the muscle relaxer I have prescribed, this is meant to help with muscle tightness. Be aware that this medication may make you drowsy therefore the first time you take this it should be at a time you are in an environment where you can rest. Do not drive or operate heavy machinery when taking this medication. Do not drink alcohol or take other sedating medications with this medicine such as narcotics or benzodiazepines.   You make take Tylenol per over the counter dosing with these medications.   We have prescribed you new medication(s) today. Discuss the medications prescribed today with your pharmacist as they can have adverse effects and interactions with your other medicines including over the counter and prescribed medications. Seek medical evaluation if you start to experience new or abnormal symptoms after taking one of these medicines, seek care immediately if you start to experience difficulty breathing, feeling of your throat closing, facial swelling, or rash as these could be indications of a more serious allergic reaction  We recommend applying a heating pad to the affected area.  Please follow-up with your orthopedic surgeon within 1 week. Return to the ER for any new or worsening symptoms or any other concerns.

## 2018-05-20 NOTE — ED Notes (Signed)
Pt lifting weights, 2 hours ago Was rising from a reclined position and felt sudden pain to his L shoulder  Now feels pain to shoulder as well as feeling that he cannot take a deep breath  Pt has applied topical strips to shoulder blade area and has taken tylenol  To Rad

## 2018-06-06 ENCOUNTER — Ambulatory Visit (INDEPENDENT_AMBULATORY_CARE_PROVIDER_SITE_OTHER): Payer: Commercial Managed Care - PPO | Admitting: Internal Medicine

## 2018-06-30 ENCOUNTER — Other Ambulatory Visit (INDEPENDENT_AMBULATORY_CARE_PROVIDER_SITE_OTHER): Payer: Self-pay | Admitting: *Deleted

## 2018-06-30 ENCOUNTER — Telehealth (INDEPENDENT_AMBULATORY_CARE_PROVIDER_SITE_OTHER): Payer: Self-pay | Admitting: *Deleted

## 2018-06-30 DIAGNOSIS — R748 Abnormal levels of other serum enzymes: Secondary | ICD-10-CM

## 2018-06-30 NOTE — Telephone Encounter (Signed)
Notes recorded by Malissa Hippoehman, Najeeb U, MD on 04/04/2018 at 5:31 PM EDT Results given to patient yesterday. Patient will do regular physical activity and abstain from alcohol use. LFTs in 3 months prior to office visit Please send reports to PCP.   The labs has been ordered for  07/07/2018 ,he will need appointment with Dr.Rehman.

## 2018-07-05 ENCOUNTER — Encounter (INDEPENDENT_AMBULATORY_CARE_PROVIDER_SITE_OTHER): Payer: Self-pay | Admitting: Internal Medicine

## 2018-07-05 ENCOUNTER — Ambulatory Visit (INDEPENDENT_AMBULATORY_CARE_PROVIDER_SITE_OTHER): Payer: Commercial Managed Care - PPO | Admitting: Internal Medicine

## 2018-07-05 ENCOUNTER — Other Ambulatory Visit (INDEPENDENT_AMBULATORY_CARE_PROVIDER_SITE_OTHER): Payer: Self-pay | Admitting: Internal Medicine

## 2018-07-05 VITALS — BP 132/94 | HR 66 | Temp 98.1°F | Resp 18 | Ht 70.0 in | Wt 162.2 lb

## 2018-07-05 DIAGNOSIS — R740 Nonspecific elevation of levels of transaminase and lactic acid dehydrogenase [LDH]: Secondary | ICD-10-CM | POA: Diagnosis not present

## 2018-07-05 DIAGNOSIS — R7401 Elevation of levels of liver transaminase levels: Secondary | ICD-10-CM

## 2018-07-05 DIAGNOSIS — R748 Abnormal levels of other serum enzymes: Secondary | ICD-10-CM | POA: Diagnosis not present

## 2018-07-05 DIAGNOSIS — K580 Irritable bowel syndrome with diarrhea: Secondary | ICD-10-CM | POA: Diagnosis not present

## 2018-07-05 LAB — HEPATIC FUNCTION PANEL
AG Ratio: 2 (calc) (ref 1.0–2.5)
ALBUMIN MSPROF: 4.5 g/dL (ref 3.6–5.1)
ALT: 34 U/L (ref 9–46)
AST: 21 U/L (ref 10–40)
Alkaline phosphatase (APISO): 83 U/L (ref 40–115)
BILIRUBIN INDIRECT: 0.3 mg/dL (ref 0.2–1.2)
Bilirubin, Direct: 0.1 mg/dL (ref 0.0–0.2)
GLOBULIN: 2.3 g/dL (ref 1.9–3.7)
TOTAL PROTEIN: 6.8 g/dL (ref 6.1–8.1)
Total Bilirubin: 0.4 mg/dL (ref 0.2–1.2)

## 2018-07-05 MED ORDER — DICYCLOMINE HCL 10 MG PO CAPS: 10.0000 mg | ORAL_CAPSULE | Freq: Three times a day (TID) | ORAL | 1 refills | Status: DC | PRN

## 2018-07-05 NOTE — Patient Instructions (Signed)
Physician will call with results of pending blood work.

## 2018-07-05 NOTE — Progress Notes (Signed)
Presenting complaint;  Follow-up for abnormal LFTs rectal bleeding and IBS.  Database and subjective:  Patient is 32 year old Caucasian male who is here for scheduled visit accompanied by his wife.  He was last seen in August this year for rectal bleeding.  He was also noted to have elevated ALT. He underwent diagnostic colonoscopy revealing internal hemorrhoids which was felt to be source of rectal bleeding.  He was also felt to have IBS and was given prescription for dicyclomine but he did not fill the prescription.  He states he forgot it. Hepatobiliary ultrasound was normal.  Hepatitis B surface antigen was negative, hepatitis B surface antibody was positive implying immunity and HCV antibody was negative.  Similarly alpha-1 antitrypsin and serum ferritin levels were normal. I felt mildly elevated transaminases are most likely due to excessive alcohol intake even though ALT was higher than AST.  He was advised to cut back on alcohol ingestion.  He states he has not had any more episodes of bleeding however he continues to have postprandial diarrhea.  He has a bowel movement within 30 minutes of each meal.  He has had very few drinks since I last saw him.  At any given time he has not had more than 2.  He does not drink every day.  He takes ibuprofen no more than once a month.  He has lost 6 pounds.  He states his weight ranges between 160 and 170 pounds.    Current Medications: Outpatient Encounter Medications as of 07/05/2018  Medication Sig  . ibuprofen (ADVIL,MOTRIN) 200 MG tablet Take 600-800 mg by mouth every 8 (eight) hours as needed (for pain).  . Naphazoline HCl (CLEAR EYES OP) Place 1 drop into both eyes 3 (three) times daily as needed (dry/irritated eyes.).  Marland Kitchen. dicyclomine (BENTYL) 10 MG capsule Take 1 capsule (10 mg total) by mouth 3 (three) times daily as needed.  . [DISCONTINUED] GOODYS EXTRA STRENGTH [phone] MG PACK Take 1 packet by mouth daily as needed (for pain.).  .  [DISCONTINUED] methocarbamol (ROBAXIN) 500 MG tablet Take 1 tablet (500 mg total) by mouth every 8 (eight) hours as needed for muscle spasms. (Patient not taking: Reported on 07/05/2018)  . [DISCONTINUED] naproxen (NAPROSYN) 500 MG tablet Take 1 tablet (500 mg total) by mouth 2 (two) times daily. (Patient not taking: Reported on 07/05/2018)   No facility-administered encounter medications on file as of 07/05/2018.      Objective: Blood pressure (!) 132/94, pulse 66, temperature 98.1 F (36.7 C), temperature source Oral, resp. rate 18, height 5\' 10"  (1.778 m), weight 162 lb 3.2 oz (73.6 kg). Patient is alert and in no acute distress. Conjunctiva is pink. Sclera is nonicteric Oropharyngeal mucosa is normal. No neck masses or thyromegaly noted. Cardiac exam with regular rhythm normal S1 and S2. No murmur or gallop noted. Lungs are clear to auscultation. Abdomen is symmetrical soft and nontender without hepatosplenomegaly. No LE edema or clubbing noted.  Labs/studies Results:   CBC Latest Ref Rng & Units 03/22/2018 09/30/2011 03/22/2007  WBC 4.0 - 10.5 K/uL 10.3 8.5 6.8  Hemoglobin 13.0 - 17.0 g/dL 47.815.9 29.516.4 62.115.6  Hematocrit 39.0 - 52.0 % 45.3 44.2 44.1  Platelets 150 - 400 K/uL 231 241 252    CMP Latest Ref Rng & Units 07/05/2018 03/15/2018 09/30/2011  Glucose 70 - 99 mg/dL - - 308(M102(H)  BUN 6 - 23 mg/dL - - 10  Creatinine 5.780.50 - 1.35 mg/dL - - 4.690.99  Sodium 629135 - 145 mEq/L - -  140  Potassium 3.5 - 5.1 mEq/L - - 3.7  Chloride 96 - 112 mEq/L - - 103  CO2 19 - 32 mEq/L - - 24  Calcium 8.4 - 10.5 mg/dL - - 9.6  Total Protein 6.1 - 8.1 g/dL 6.8 6.9 -  Total Bilirubin 0.2 - 1.2 mg/dL 0.4 0.4 -  AST 10 - 40 U/L 21 43(H) -  ALT 9 - 46 U/L 34 60(H) -    Hepatic Function Latest Ref Rng & Units 07/05/2018 03/15/2018  Total Protein 6.1 - 8.1 g/dL 6.8 6.9  AST 10 - 40 U/L 21 43(H)  ALT 9 - 46 U/L 34 60(H)  Total Bilirubin 0.2 - 1.2 mg/dL 0.4 0.4  Bilirubin, Direct 0.0 - 0.2 mg/dL 0.1 0.1      Assessment:  #1.  Rectal bleeding secondary to hemorrhoids.  He has not had any bleeding episodes since colonoscopy of March 25, 2018.  #2.  Irritable bowel syndrome.  He has postprandial diarrhea.  Symptoms are typical and hopefully he will respond to dicyclomine.  #3.  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  His transaminases are now normal.  He is immune to hepatitis B and HCV antibody was nonreactive.  Serum ferritin and alpha-1 antitrypsin levels are normal.  No further work-up at this time other than repeating LFTs in 6 months.   Plan:  Dicyclomine 10 mg p.o. 3 times daily as needed. LFTs in 6 months. Office visit in 1 year.

## 2018-07-06 ENCOUNTER — Other Ambulatory Visit (INDEPENDENT_AMBULATORY_CARE_PROVIDER_SITE_OTHER): Payer: Self-pay | Admitting: *Deleted

## 2018-07-06 DIAGNOSIS — R7401 Elevation of levels of liver transaminase levels: Secondary | ICD-10-CM

## 2018-07-06 DIAGNOSIS — R740 Nonspecific elevation of levels of transaminase and lactic acid dehydrogenase [LDH]: Principal | ICD-10-CM

## 2018-09-21 DIAGNOSIS — H109 Unspecified conjunctivitis: Secondary | ICD-10-CM | POA: Diagnosis not present

## 2018-09-21 DIAGNOSIS — Z6825 Body mass index (BMI) 25.0-25.9, adult: Secondary | ICD-10-CM | POA: Diagnosis not present

## 2018-12-07 ENCOUNTER — Encounter (INDEPENDENT_AMBULATORY_CARE_PROVIDER_SITE_OTHER): Payer: Self-pay | Admitting: *Deleted

## 2018-12-07 ENCOUNTER — Other Ambulatory Visit (INDEPENDENT_AMBULATORY_CARE_PROVIDER_SITE_OTHER): Payer: Self-pay | Admitting: *Deleted

## 2018-12-07 DIAGNOSIS — R7401 Elevation of levels of liver transaminase levels: Secondary | ICD-10-CM

## 2019-07-04 ENCOUNTER — Other Ambulatory Visit (INDEPENDENT_AMBULATORY_CARE_PROVIDER_SITE_OTHER): Payer: Self-pay | Admitting: *Deleted

## 2019-07-04 ENCOUNTER — Other Ambulatory Visit: Payer: Self-pay

## 2019-07-04 ENCOUNTER — Ambulatory Visit (INDEPENDENT_AMBULATORY_CARE_PROVIDER_SITE_OTHER): Payer: Commercial Managed Care - PPO | Admitting: Internal Medicine

## 2019-07-04 ENCOUNTER — Encounter (INDEPENDENT_AMBULATORY_CARE_PROVIDER_SITE_OTHER): Payer: Self-pay | Admitting: Internal Medicine

## 2019-07-04 DIAGNOSIS — R7989 Other specified abnormal findings of blood chemistry: Secondary | ICD-10-CM | POA: Insufficient documentation

## 2019-07-04 DIAGNOSIS — R945 Abnormal results of liver function studies: Secondary | ICD-10-CM

## 2019-07-04 DIAGNOSIS — R7401 Elevation of levels of liver transaminase levels: Secondary | ICD-10-CM

## 2019-07-04 DIAGNOSIS — K648 Other hemorrhoids: Secondary | ICD-10-CM

## 2019-07-04 DIAGNOSIS — K589 Irritable bowel syndrome without diarrhea: Secondary | ICD-10-CM | POA: Insufficient documentation

## 2019-07-04 DIAGNOSIS — K580 Irritable bowel syndrome with diarrhea: Secondary | ICD-10-CM

## 2019-07-04 NOTE — Progress Notes (Signed)
Virtual Visit via Telephone Note  Patient had face-to-face scheduled visit today.  Visit was changed to virtual/telephone visit because of Covid-19 pandemic and patient agreed. I connected with Ross Phillips on 07/04/19 at 12:13 ESTby telephone and verified that I am speaking with the correct person using two identifiers.  Location: Patient: office Provider: [facility]   I discussed the limitations, risks, security and privacy concerns of performing an evaluation and management service by telephone and the availability of in person appointments. I also discussed with the patient that there may be a patient responsible charge related to this service. The patient expressed understanding and agreed to proceed.   History of Present Illness:  Patient is 33 year old Caucasian male 33 year old male who underwent colonoscopy in September last year because of 1 year history of intermittent rectal bleeding as well as postprandial diarrhea.  He was also noted to have mildly elevated transaminases. Colonoscopy revealed internal hemorrhoids which was felt to be the source of his bleeding.  He was felt to have IBS and given prescription for dicyclomine. AST ALT ratio was not typical of alcoholic hepatitis.  HCV antibody was nonreactive.  Serologic studies revealed immunity hepatitis B.  Serum ferritin and alpha-1 antitrypsin levels are normal.  Ultrasound in September last year was within normal limits. Patient was advised to cut back on alcohol intake or stop it altogether if possible.  Patient says he is doing well.  He has occasional hematochezia with his bowel movement when he passes small amount of blood.  His urgency has improved a great deal.  He is watching his diet.  He may have an episode once a week.  He did not take dicyclomine.  He states he drinks alcohol maybe once a week or less often.  It is mainly beer and no other drinks.  His appetite is good.  He denies abdominal pain. He did not have blood  work as planned..    Observations/Objective: Weight 1 year ago was 162 pounds. Patient feels his weight is same or maybe few pounds less.  Lab data from 03/15/2018 AST 43 and ALT 60.  Lab data from 07/05/2018 AST 21 and ALT 34.  Assessment and Plan:  #1.  Hematochezia secondary to internal hemorrhoids well-documented on colonoscopy of September 2019.  He is having sporadic episodes.  Therefore no further therapy needed at this time.  #2.  History of mildly elevated LFTs felt to be alcohol mediated injury.  Transaminases were normal 1 year ago.  LFTs need to be repeated.  #3.  Postprandial diarrhea with urgency felt to be due to IBS.  He has not tried dicyclomine and he appears to be doing well with dietary measures.  Follow Up Instructions:  Patient will go to the lab for LFTs. If LFTs are normal he will return to office on as-needed basis. I discussed the assessment and treatment plan with the patient. The patient was provided an opportunity to ask questions and all were answered. The patient agreed with the plan and demonstrated an understanding of the instructions.   The patient was advised to call back or seek an in-person evaluation if the symptoms worsen or if the condition fails to improve as anticipated.  I provided 7 minutes of non-face-to-face time during this encounter.   Hildred Laser, MD

## 2019-07-04 NOTE — Patient Instructions (Signed)
Will contact patient with results of LFTs when completed.

## 2019-07-05 ENCOUNTER — Telehealth (INDEPENDENT_AMBULATORY_CARE_PROVIDER_SITE_OTHER): Payer: Self-pay | Admitting: Internal Medicine

## 2019-07-05 LAB — HEPATIC FUNCTION PANEL
AG Ratio: 2.1 (calc) (ref 1.0–2.5)
ALT: 22 U/L (ref 9–46)
AST: 15 U/L (ref 10–40)
Albumin: 4.6 g/dL (ref 3.6–5.1)
Alkaline phosphatase (APISO): 62 U/L (ref 36–130)
Bilirubin, Direct: 0.1 mg/dL (ref 0.0–0.2)
Globulin: 2.2 g/dL (calc) (ref 1.9–3.7)
Indirect Bilirubin: 0.4 mg/dL (calc) (ref 0.2–1.2)
Total Bilirubin: 0.5 mg/dL (ref 0.2–1.2)
Total Protein: 6.8 g/dL (ref 6.1–8.1)

## 2019-07-05 NOTE — Telephone Encounter (Signed)
Please let the patient know that Dr.Rehman will let the patient know the results when he gets them.

## 2019-07-05 NOTE — Telephone Encounter (Signed)
Patient left voice mail message regarding lab results - ph# [phone]

## 2019-10-20 IMAGING — DX DG SHOULDER 2+V*L*
3 series · 3 of 3 positions shown · non-contrast
Comparison: 09/30/2011

CLINICAL DATA: Left shoulder pain

EXAM:
LEFT SHOULDER - 2+ VIEW

[shoulder grashey]
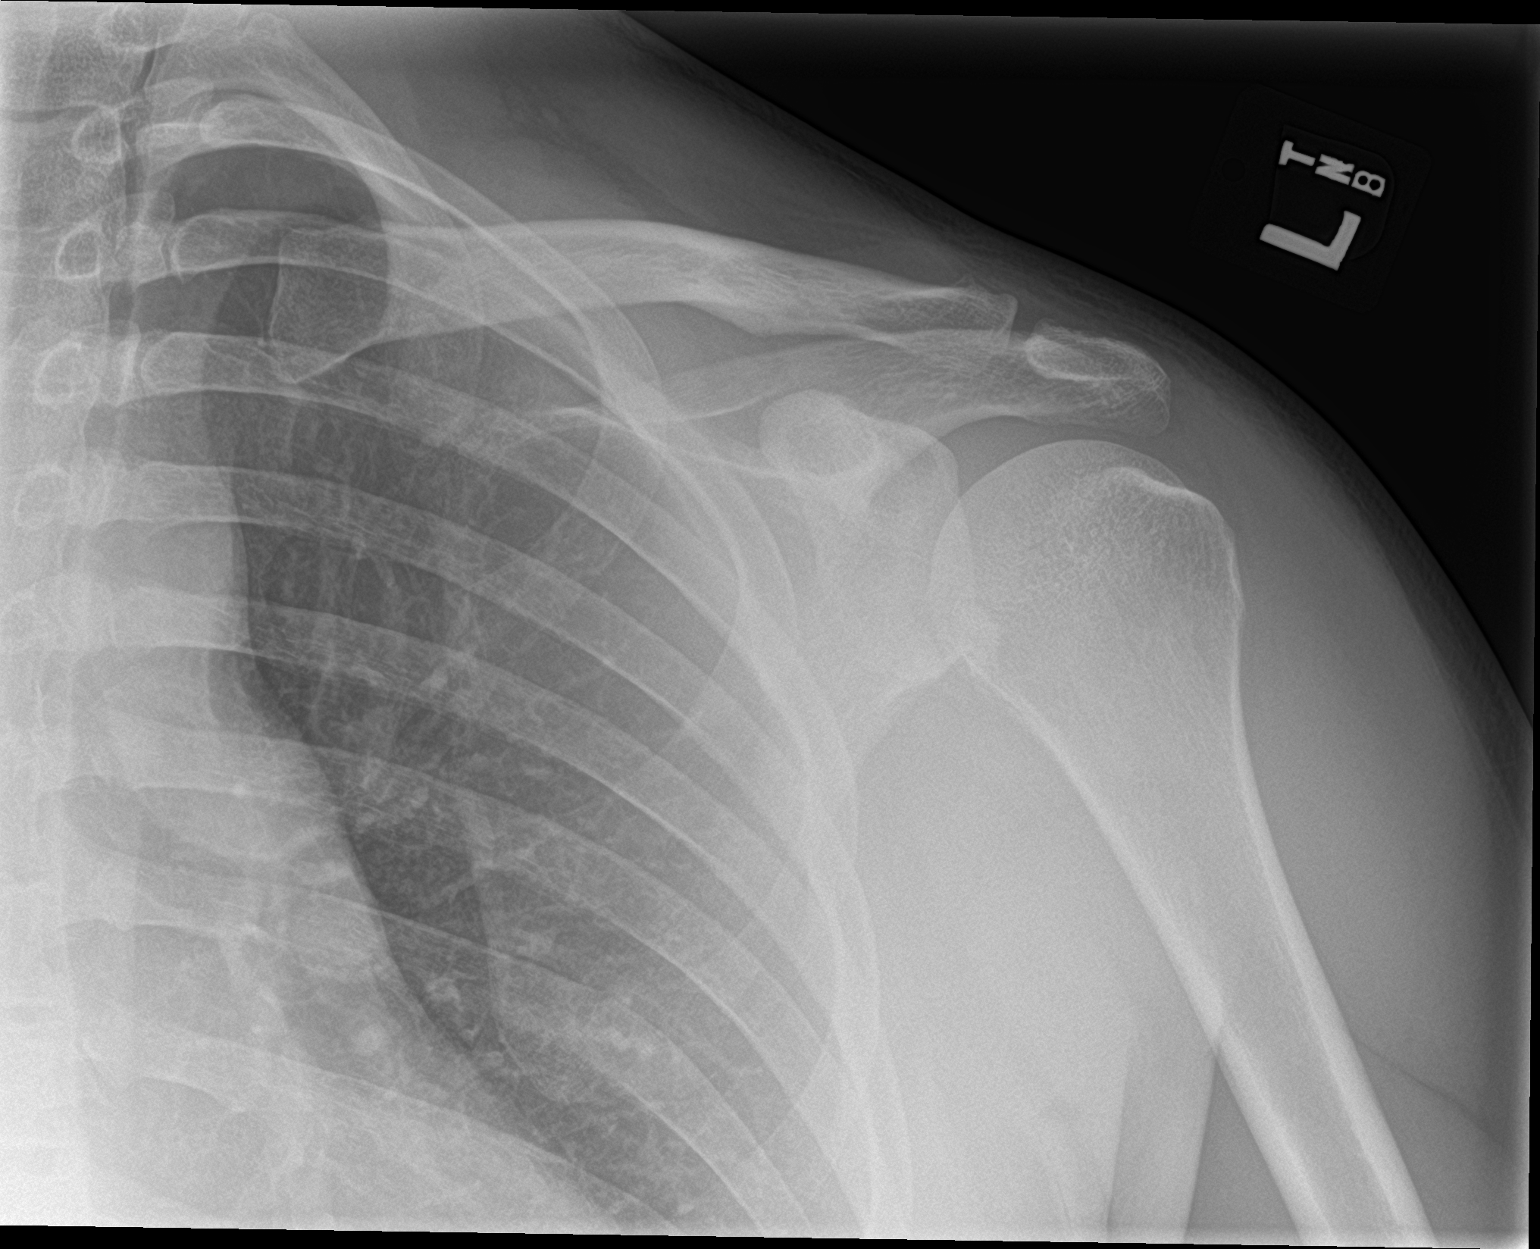

[shoulder y view]
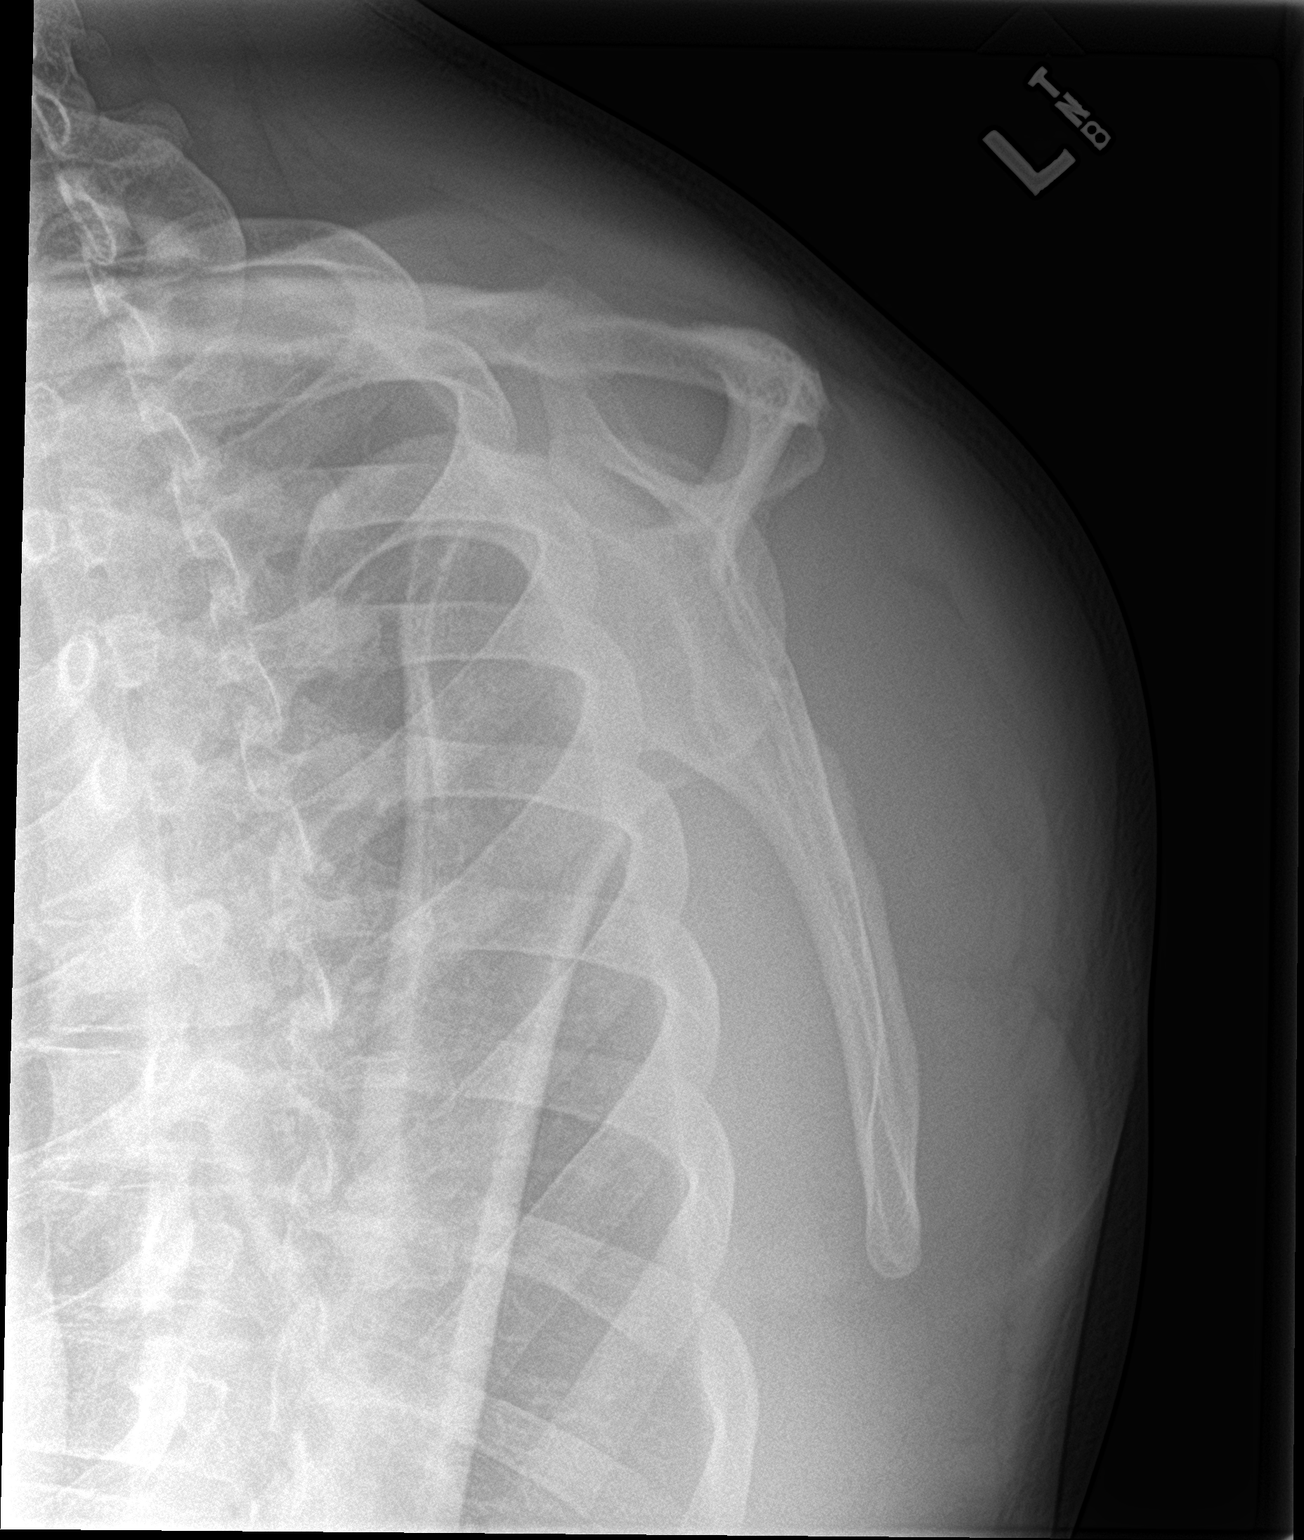

[shoulder axillary]
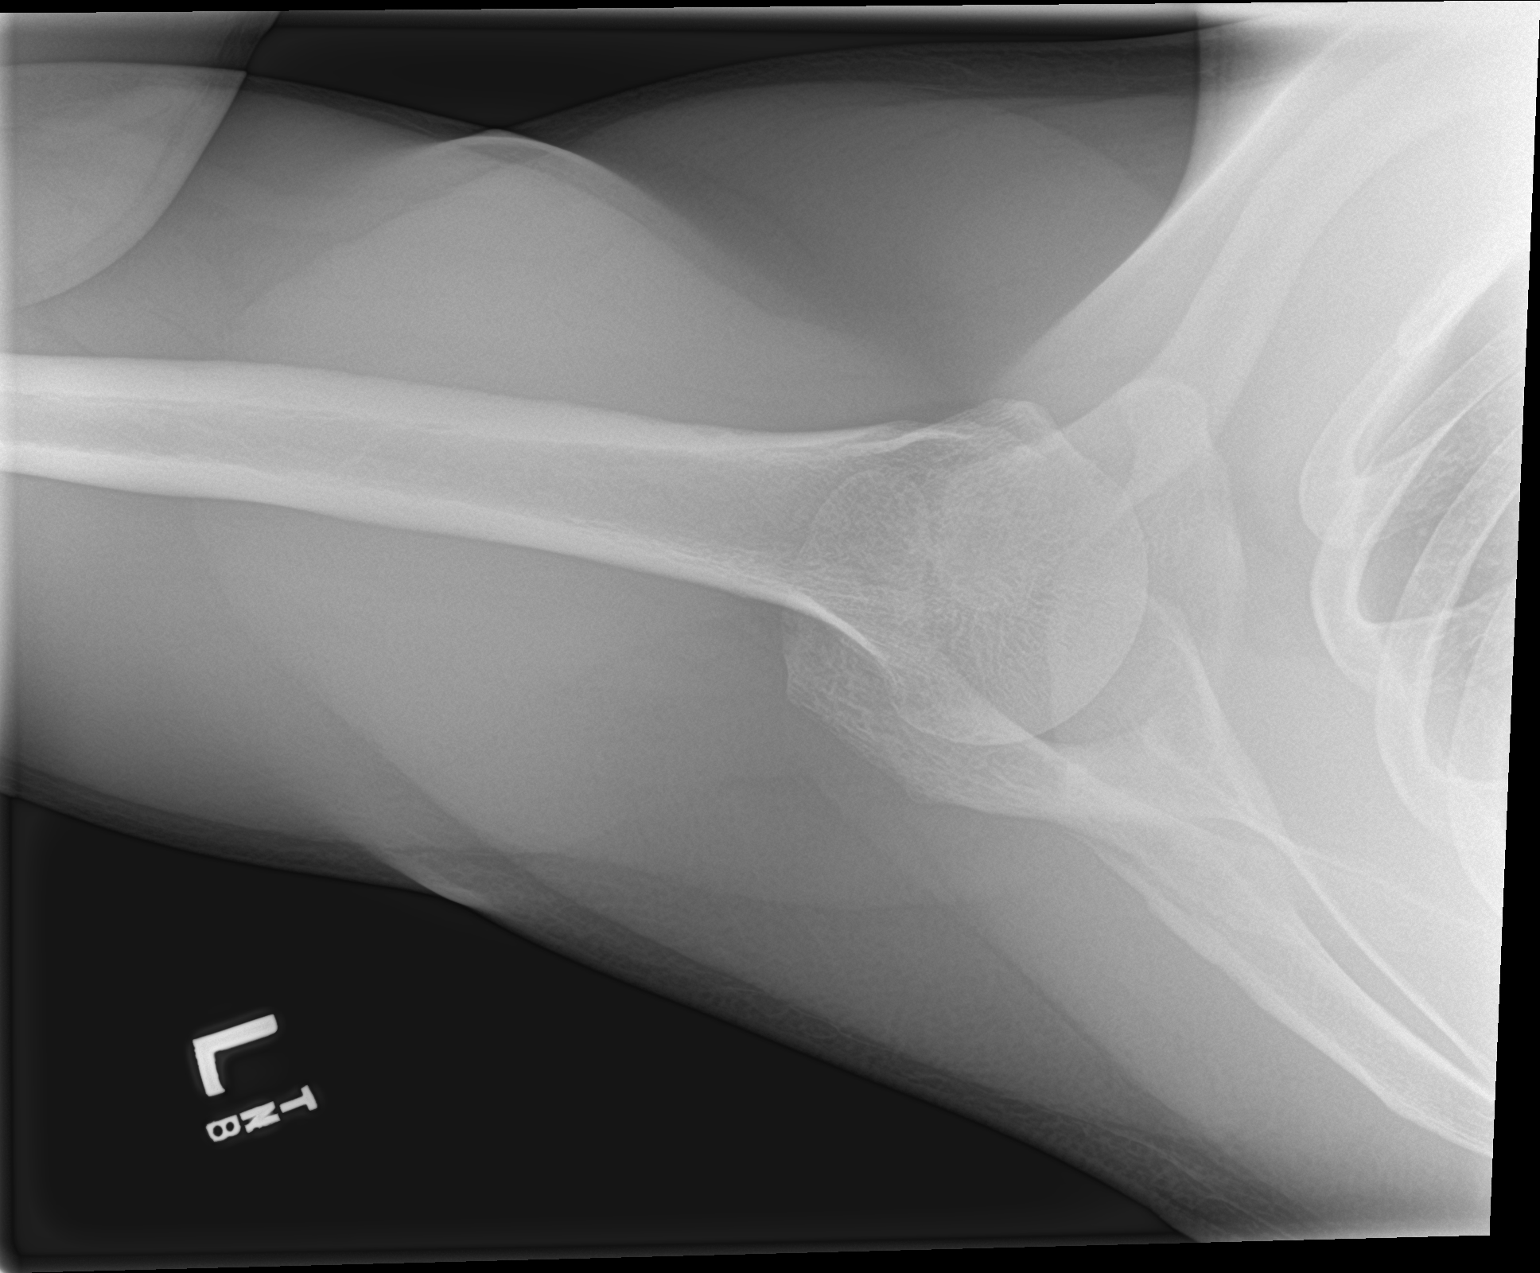

[3 of 3 positions shown; findings below may reference images not displayed]

FINDINGS: Normal alignment no fracture. AC joint intact. No significant
degenerative change. Small sclerotic focus in the medial humeral
head is unchanged from the prior study.

Scapular fracture on the prior study has healed.
IMPRESSION: Negative.

## 2019-10-20 IMAGING — DX DG CHEST 2V
2 series · 2 of 2 positions shown · non-contrast
Comparison: 01/17/2015

CLINICAL DATA: Left shoulder pain.

EXAM:
CHEST - 2 VIEW

[chest pa]
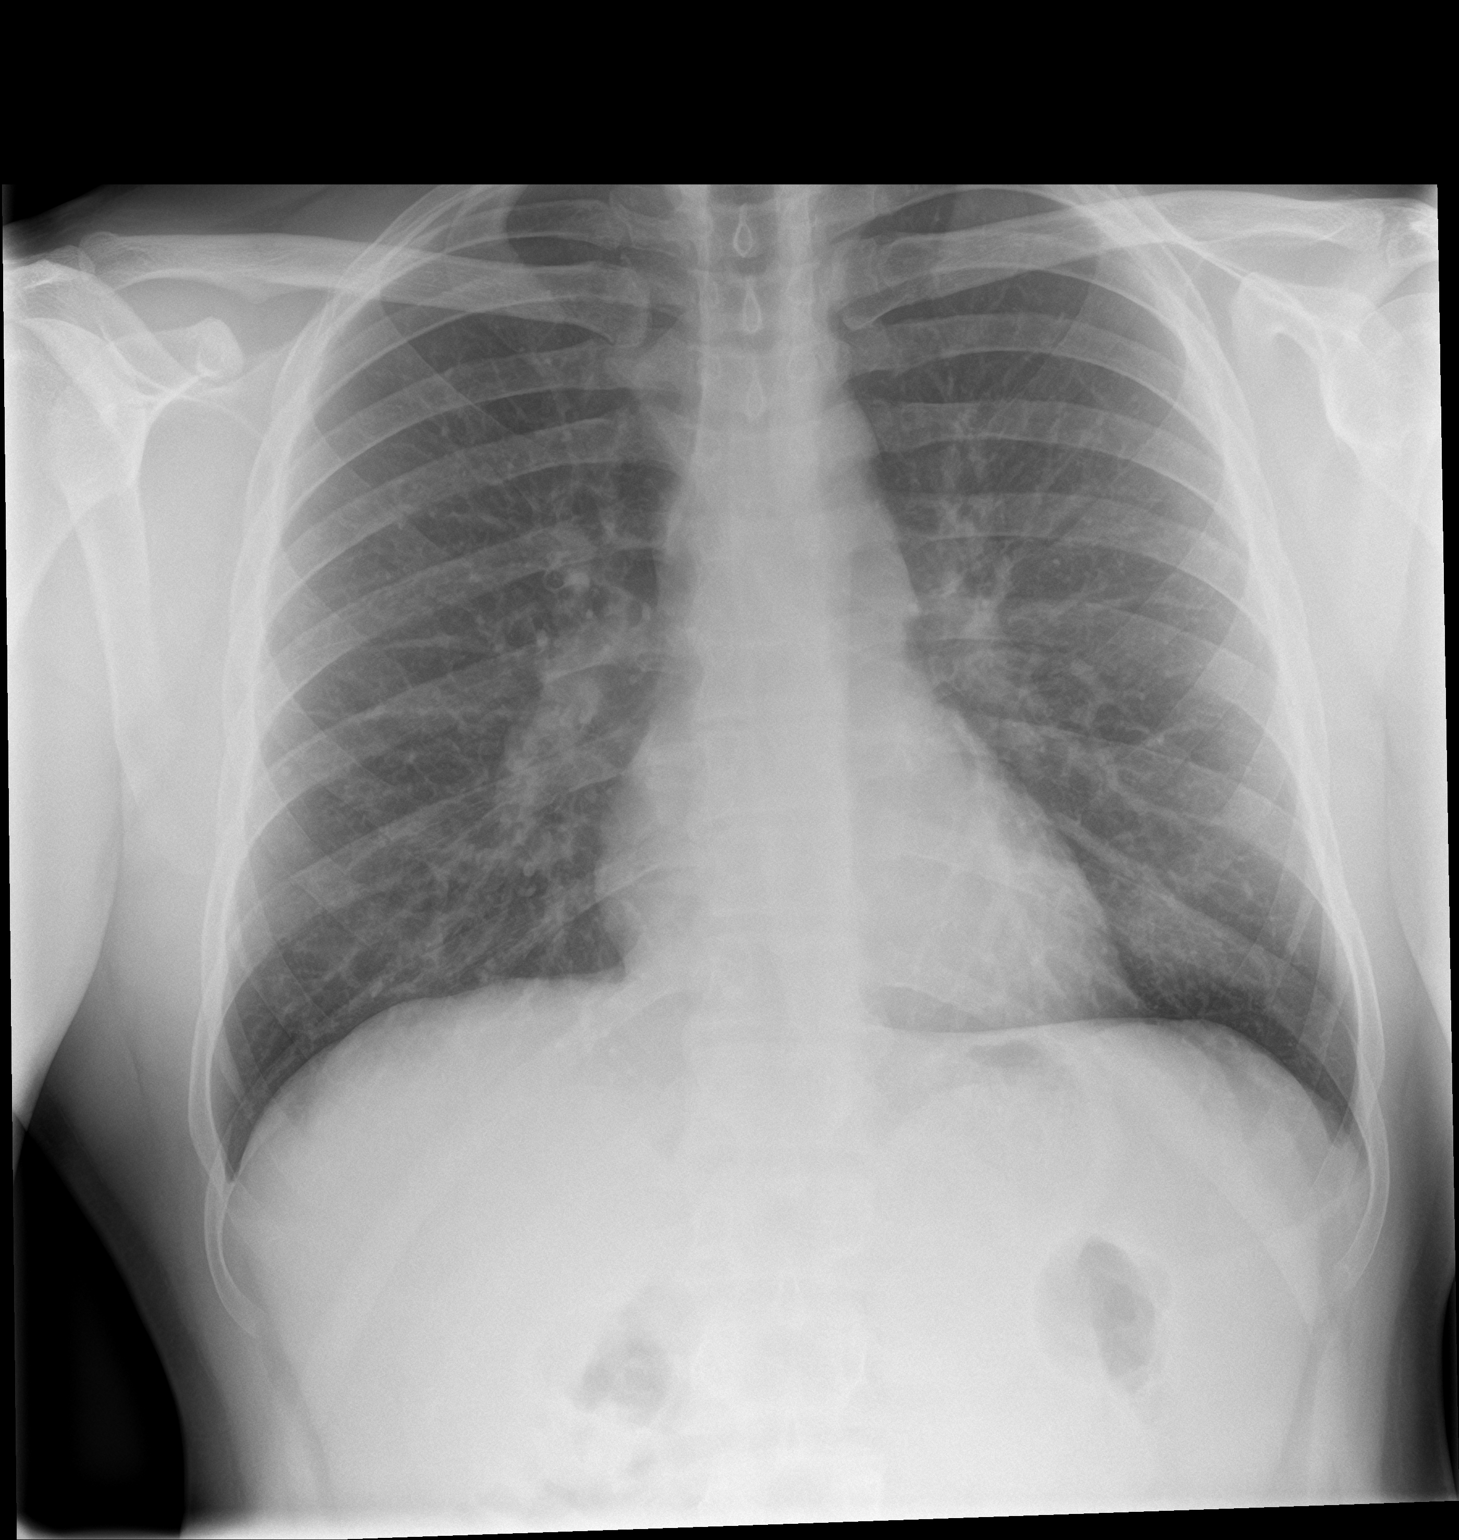

[chest lat]
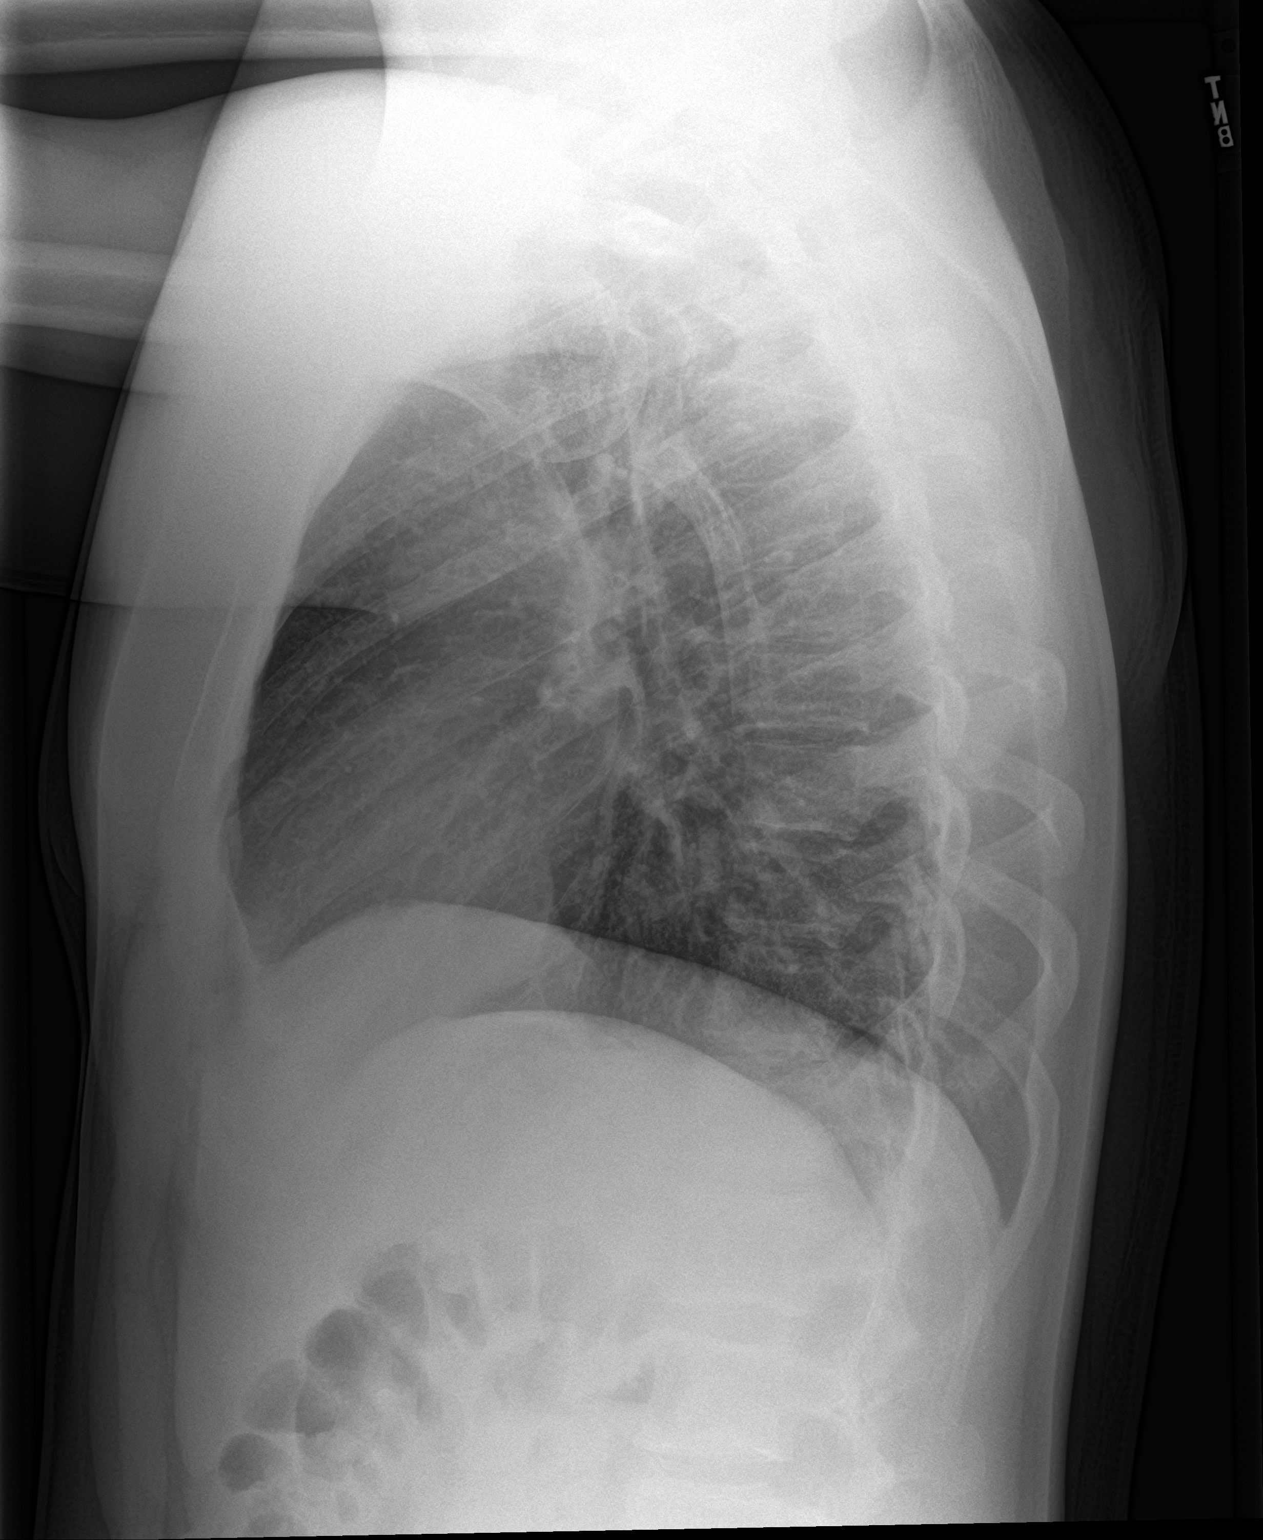

[2 of 2 positions shown; findings below may reference images not displayed]

FINDINGS: The heart size and mediastinal contours are within normal limits.
Both lungs are clear. The visualized skeletal structures are
unremarkable.
IMPRESSION: No active cardiopulmonary disease.

## 2020-07-22 ENCOUNTER — Encounter (INDEPENDENT_AMBULATORY_CARE_PROVIDER_SITE_OTHER): Payer: Self-pay | Admitting: Internal Medicine

## 2024-04-27 ENCOUNTER — Encounter (INDEPENDENT_AMBULATORY_CARE_PROVIDER_SITE_OTHER): Payer: Self-pay | Admitting: *Deleted

## 2024-05-11 ENCOUNTER — Encounter (INDEPENDENT_AMBULATORY_CARE_PROVIDER_SITE_OTHER): Payer: Self-pay | Admitting: Gastroenterology

## 2024-05-11 ENCOUNTER — Ambulatory Visit (INDEPENDENT_AMBULATORY_CARE_PROVIDER_SITE_OTHER): Admitting: Gastroenterology

## 2024-05-11 VITALS — BP 143/98 | HR 73 | Temp 97.8°F | Ht 68.0 in | Wt 159.0 lb

## 2024-05-11 DIAGNOSIS — K648 Other hemorrhoids: Secondary | ICD-10-CM

## 2024-05-11 DIAGNOSIS — K625 Hemorrhage of anus and rectum: Secondary | ICD-10-CM | POA: Diagnosis not present

## 2024-05-11 MED ORDER — HYDROCORTISONE ACETATE 25 MG RE SUPP: 25.0000 mg | Freq: Two times a day (BID) | RECTAL | 0 refills | Status: AC

## 2024-05-11 NOTE — Patient Instructions (Signed)
 Schedule flexible sigmoidoscopy Use Anusol suppositories twice a day for 1 week. Do no strain to move bowels Use sitz bath regularly - can buy at [facility] or [facility]

## 2024-05-11 NOTE — Progress Notes (Unsigned)
 Toribio Fortune, M.D. Gastroenterology & Hepatology [facility] [address] [place], [place] [address] Primary Care Physician: Dow Longs, PA-C [address] [place] [address]  Referring MD: PCP  Chief Complaint:  rectal bleeidng  History of Present Illness: Ross Phillips is a 38 y.o. male with PMH internal hemorrhoids and HTN, who presents for evaluation of rectal bleeding.  Patient has been having recurrent issues with rectal bleeding. States that he has had rectal bleeding 80-90% of the time he moves his bowels. He has noticed if he does not sit too long in the toilet, he will bleed less often. He was seen in the GI clinic by Dr. Golda in 2020 for the same reasons, but he states nothing has changed since then.  He states that as long as he can remember his stools are loose nad has 2 Bms per day. However, he has noticed if he holds the stool and moves his bowels once a day, is stools are formed.  The patient denies having any nausea, vomiting, fever, chills,  melena, hematemesis, abdominal distention, abdominal pain, jaundice, pruritus or weight loss. No dyschezia or straining.  Last ZHI:wzczm Last Colonoscopy: 03/25/2018 Internal hemorrhoids  FHx: neg for any gastrointestinal/liver disease, no malignancies Social: quit smoking recently, neg frequent alcohol, neg illicit drug use Surgical: no abdominal surgeries  Past Medical History: Past Medical History:  Diagnosis Date   Hypertension     Past Surgical History: Past Surgical History:  Procedure Laterality Date   COLONOSCOPY WITH PROPOFOL  N/A 03/25/2018   Procedure: COLONOSCOPY WITH PROPOFOL ;  Surgeon: Golda Claudis PENNER, MD;  Location: [facility];  Service: Endoscopy;  Laterality: N/A;  12:05   TOOTH EXTRACTION     as a child    Family History: Family History  Problem Relation Age of Onset   Anxiety disorder Mother    Hypertension Father    High Cholesterol Father     Irritable bowel syndrome Sister    Healthy Brother    Healthy Son    Healthy Son     Social History: Social History   Tobacco Use  Smoking Status Some Days   Current packs/day: 0.50   Average packs/day: 0.5 packs/day for 4.0 years (2.0 ttl pk-yrs)   Types: Cigarettes  Smokeless Tobacco Current   Types: Snuff   Social History   Substance and Sexual Activity  Alcohol Use Yes   Comment: occasionally   Social History   Substance and Sexual Activity  Drug Use No    Allergies: No Known Allergies  Medications: Current Outpatient Medications  Medication Sig Dispense Refill   Acetaminophen  (TYLENOL  PO) Take 500 mg by mouth. As needed for headache.     No current facility-administered medications for this visit.    Review of Systems: GENERAL: negative for malaise, night sweats HEENT: No changes in hearing or vision, no nose bleeds or other nasal problems. NECK: Negative for lumps, goiter, pain and significant neck swelling RESPIRATORY: Negative for cough, wheezing CARDIOVASCULAR: Negative for chest pain, leg swelling, palpitations, orthopnea GI: SEE HPI MUSCULOSKELETAL: Negative for joint pain or swelling, back pain, and muscle pain. SKIN: Negative for lesions, rash PSYCH: Negative for sleep disturbance, mood disorder and recent psychosocial stressors. HEMATOLOGY Negative for prolonged bleeding, bruising easily, and swollen nodes. ENDOCRINE: Negative for cold or heat intolerance, polyuria, polydipsia and goiter. NEURO: negative for tremor, gait imbalance, syncope and seizures. The remainder of the review of systems is noncontributory.   Physical Exam: BP ROLLEN)  143/98 (BP Location: Right Arm, Patient Position: Sitting, Cuff Size: Normal)   Pulse 73   Temp 97.8 F (36.6 C) (Temporal)   Ht 5' 8 (1.727 m)   Wt 159 lb (72.1 kg)   BMI 24.18 kg/m  GENERAL: The patient is AO x3, in no acute distress. HEENT: Head is normocephalic and atraumatic. EOMI are intact. Mouth  is well hydrated and without lesions. NECK: Supple. No masses LUNGS: Clear to auscultation. No presence of rhonchi/wheezing/rales. Adequate chest expansion HEART: RRR, normal s1 and s2. ABDOMEN: Soft, nontender, no guarding, no peritoneal signs, and nondistended. BS +. No masses. RECTAL EXAM: deferred EXTREMITIES: Without any cyanosis, clubbing, rash, lesions or edema. NEUROLOGIC: AOx3, no focal motor deficit. SKIN: no jaundice, no rashes   Imaging/Labs: as above  I personally reviewed and interpreted the available labs, imaging and endoscopic files.  Impression and Plan: [name] is a 38 y.o. male with PMH internal hemorrhoids and HTN, who presents for evaluation of rectal bleeding. Patient reports recurrent painless rectal bleeding without associated symptoms. This is likely related to symptomatic hemorrhoids given previous endoscopic evaluation. We discussed proceeding with a flex sigmoidoscopy to assess the hemorrhoid size. For now will recommend Anusol suppositories and conservative measures. If still symptomatic, may consider banding.  -Schedule flexible sigmoidoscopy -Use Anusol suppositories twice a day for 1 week. -Do no strain to move bowels -Use sitz bath regularly  All questions were answered.      Toribio Fortune, MD Gastroenterology and Hepatology [facility]

## 2024-05-17 ENCOUNTER — Telehealth: Payer: Self-pay | Admitting: *Deleted

## 2024-05-17 NOTE — Telephone Encounter (Signed)
 Called pt. Scheduled for flex sig 11/24. Aware will mail instructions to him  PA approved via [employer] Authorization #: [id number] DOS 05/17/24-06/19/2024

## 2024-05-25 ENCOUNTER — Encounter (INDEPENDENT_AMBULATORY_CARE_PROVIDER_SITE_OTHER): Payer: Self-pay | Admitting: Gastroenterology

## 2024-06-07 ENCOUNTER — Telehealth (INDEPENDENT_AMBULATORY_CARE_PROVIDER_SITE_OTHER): Payer: Self-pay | Admitting: *Deleted

## 2024-06-07 NOTE — Telephone Encounter (Signed)
 Per Harlene in endo he wants to cancel his flex sig scheduled for 06/12/24. He does not want to reschedule at this time.

## 2024-06-07 NOTE — Progress Notes (Signed)
 Called Ross Phillips to review information for upcoming procedure on 06/12/24. Patient said he wanted to cancel procedure and did not wish to reschedule at this time. Office notified.

## 2024-06-12 ENCOUNTER — Ambulatory Visit (HOSPITAL_COMMUNITY): Admission: RE | Admit: 2024-06-12 | Source: Home / Self Care | Admitting: Gastroenterology

## 2024-06-12 ENCOUNTER — Encounter (HOSPITAL_COMMUNITY): Admission: RE | Payer: Self-pay | Source: Home / Self Care

## 2024-06-12 SURGERY — SIGMOIDOSCOPY, FLEXIBLE
Anesthesia: Choice

## 2024-07-04 ENCOUNTER — Encounter (INDEPENDENT_AMBULATORY_CARE_PROVIDER_SITE_OTHER): Payer: Self-pay | Admitting: Gastroenterology
